# Patient Record
Sex: Male | Born: 1955 | Race: White | Hispanic: No | Marital: Married | Smoking: Current every day smoker
Health system: Southern US, Community
[De-identification: ages and names within clinical notes are randomized; demographics above are authoritative.]

## PROBLEM LIST (undated history)

## (undated) DIAGNOSIS — E785 Hyperlipidemia, unspecified: Secondary | ICD-10-CM

## (undated) DIAGNOSIS — Z9049 Acquired absence of other specified parts of digestive tract: Secondary | ICD-10-CM

## (undated) DIAGNOSIS — N433 Hydrocele, unspecified: Secondary | ICD-10-CM

## (undated) DIAGNOSIS — F32A Depression, unspecified: Secondary | ICD-10-CM

## (undated) DIAGNOSIS — K219 Gastro-esophageal reflux disease without esophagitis: Secondary | ICD-10-CM

## (undated) DIAGNOSIS — J449 Chronic obstructive pulmonary disease, unspecified: Secondary | ICD-10-CM

## (undated) DIAGNOSIS — N529 Male erectile dysfunction, unspecified: Secondary | ICD-10-CM

## (undated) DIAGNOSIS — F411 Generalized anxiety disorder: Secondary | ICD-10-CM

## (undated) DIAGNOSIS — M1990 Unspecified osteoarthritis, unspecified site: Secondary | ICD-10-CM

## (undated) DIAGNOSIS — Z973 Presence of spectacles and contact lenses: Secondary | ICD-10-CM

## (undated) DIAGNOSIS — F329 Major depressive disorder, single episode, unspecified: Secondary | ICD-10-CM

## (undated) HISTORY — DX: Depression, unspecified: F32.A

## (undated) HISTORY — DX: Major depressive disorder, single episode, unspecified: F32.9

## (undated) HISTORY — PX: LAPAROSCOPIC INGUINAL HERNIA REPAIR: SUR788

## (undated) HISTORY — DX: Hyperlipidemia, unspecified: E78.5

---

## 1999-08-19 ENCOUNTER — Emergency Department (HOSPITAL_COMMUNITY): Admission: EM | Admit: 1999-08-19 | Discharge: 1999-08-19 | Payer: Self-pay | Admitting: Emergency Medicine

## 1999-08-24 ENCOUNTER — Ambulatory Visit (HOSPITAL_COMMUNITY): Admission: RE | Admit: 1999-08-24 | Discharge: 1999-08-25 | Payer: Self-pay | Admitting: Ophthalmology

## 1999-08-24 ENCOUNTER — Encounter: Payer: Self-pay | Admitting: Ophthalmology

## 2003-10-12 ENCOUNTER — Emergency Department (HOSPITAL_COMMUNITY): Admission: EM | Admit: 2003-10-12 | Discharge: 2003-10-12 | Payer: Self-pay | Admitting: *Deleted

## 2007-10-05 ENCOUNTER — Emergency Department (HOSPITAL_COMMUNITY): Admission: EM | Admit: 2007-10-05 | Discharge: 2007-10-05 | Payer: Self-pay | Admitting: Emergency Medicine

## 2009-08-23 HISTORY — PX: PARS PLANA VITRECTOMY W/ ENDOPHOTOCOAGULATION: SHX2168

## 2010-04-30 ENCOUNTER — Ambulatory Visit
Admission: RE | Admit: 2010-04-30 | Discharge: 2010-04-30 | Disposition: A | Discharge status: Home / Self Care | Payer: Worker's Compensation | Source: Ambulatory Visit | Attending: Occupational Medicine | Admitting: Occupational Medicine

## 2010-04-30 ENCOUNTER — Other Ambulatory Visit: Payer: Self-pay | Admitting: Occupational Medicine

## 2010-04-30 DIAGNOSIS — IMO0001 Reserved for inherently not codable concepts without codable children: Secondary | ICD-10-CM

## 2010-08-06 NOTE — Op Note (Signed)
[place]. [facility]  Patient:    Jeffery Downs, Jeffery Downs                     MRN: [id number] Proc. Date: 08/24/99 Attending:  Beulah Gandy. Ashley Royalty, M.D.                           Operative Report  DATE OF BIRTH: [birth year]  PREOPERATIVE DIAGNOSIS: 1. Retained lens material in the right eye. 2. Glaucoma, right eye. 3. Iridodialysis, right eye. 4. Previous trauma, right eye.  POSTOPERATIVE DIAGNOSIS:  OPERATION PERFORMED:  Pars plana vitrectomy with retinal photocoagulation in the right eye.  SURGEON:  Beulah Gandy. Ashley Royalty, M.D.  ASSISTANT:  Alma Downs, P.A.  ANESTHESIA:  General.  INDICATIONS FOR PROCEDURE:  Usual prep and drape.  Peritomies at 8, 10 and 2 oclock.  A 4 mm angled infusion port anchored into place at 8 oclock.  The lighted pick and the cutter were placed at 10 and 2 oclock respectively. Contact lens ring was anchored into place at 6 and 12 oclock.  The superior rectus suture was placed.  The vitrectomy was begun in the iris plane where lens material was carefully removed with a vitreous cutter.  There was lens material beneath the iris and this was carefully removed.  The vitrectomy was carried posteriorly where additional lens fragments were encountered.  These were carefully removed with the suction cutter.  The vitrectomy was carried around for 360 degrees down to the vitreous base with a 30 degree prismatic lens.  All vitreous was removed under low suction and rapid cutting.  All remnants were flushed from the anterior chamber angle of the eye as well. These were removed with the vitreous cutter.  Once this was accomplished, the intruments were removed from the eye and 9-0 nylon was used to close the sclerotomy sites.  Indirect ophthalmoscope laser was moved into place and 484 burns were placed around the retinal periphery in two rows.  The power was 400 milliwatts, 1000 microns each and 0.1 second each.  The conjunctiva  was reposited with wetfield cautery.  Polymyxin and gentamicin were irrigated into Ten[place] space.  Atropine solution was applied.  Marcaine was injected around the globe for postoperative pain.  Closing tension was 10 with a Barraquer tonometer.  COMPLICATIONS:  None.  DURATION:  One hour.  The patient was awakened and taken to recovery in satisfactory condition.  DESCRIPTION OF PROCEDURE: DD:  08/24/99 TD:  08/27/99 Job: [id number]

## 2011-08-19 ENCOUNTER — Encounter (INDEPENDENT_AMBULATORY_CARE_PROVIDER_SITE_OTHER): Payer: Self-pay | Admitting: Surgery

## 2011-08-25 ENCOUNTER — Encounter (INDEPENDENT_AMBULATORY_CARE_PROVIDER_SITE_OTHER): Payer: Self-pay | Admitting: Surgery

## 2011-08-25 ENCOUNTER — Ambulatory Visit (INDEPENDENT_AMBULATORY_CARE_PROVIDER_SITE_OTHER): Payer: 59 | Admitting: Surgery

## 2011-08-25 VITALS — BP 121/88 | HR 88 | Temp 98.3°F | Resp 16 | Ht 73.0 in | Wt 182.0 lb

## 2011-08-25 DIAGNOSIS — K4020 Bilateral inguinal hernia, without obstruction or gangrene, not specified as recurrent: Secondary | ICD-10-CM

## 2011-08-25 NOTE — Progress Notes (Signed)
Patient ID: Jeffery Downs, male   DOB: Jul 22, 1955, 56 y.o.   MRN: [id number]  Chief Complaint  Patient presents with  . Pre-op Exam    eval [facility]    HPI Jeffery Downs is a 56 y.o. male.   This is a very pleasant gentleman referred by Dr. Benedetto Goad for a left groin bulge. The patient has noticed it for a year period it always easily reduces. He has also noticed a very small bulge in the right groin. He has almost no discomfort. He has no obstructive symptoms. He is otherwise without complaintsHPI  Past Medical History  Diagnosis Date  . Acute sinusitis, unspecified   . Chronic airway obstruction, not elsewhere classified   . Depression   . Anxiety   . Hyperlipidemia   . Psychosexual dysfunction with inhibited sexual excitement     Past Surgical History  Procedure Date  . Eye surgery     Family History  Problem Relation Age of Onset  . Cancer Mother     breast  . Heart disease Father     Social History History  Substance Use Topics  . Smoking status: Current Everyday Smoker    Types: Cigarettes  . Smokeless tobacco: Not on file  . Alcohol Use: No    No Known Allergies  Current Outpatient Prescriptions  Medication Sig Dispense Refill  . ALPRAZolam (XANAX) 1 MG tablet Take 1 mg by mouth at bedtime as needed.      . citalopram (CELEXA) 20 MG tablet Take 20 mg by mouth daily.        Review of Systems Review of Systems  Constitutional: Negative for fever, chills and unexpected weight change.  HENT: Negative for hearing loss, congestion, sore throat, trouble swallowing and voice change.   Eyes: Negative for visual disturbance.  Respiratory: Negative for cough and wheezing.   Cardiovascular: Negative for chest pain, palpitations and leg swelling.  Gastrointestinal: Negative for nausea, vomiting, abdominal pain, diarrhea, constipation, blood in stool, abdominal distention, anal bleeding and rectal pain.  Genitourinary: Negative for hematuria and difficulty urinating.   Musculoskeletal: Negative for arthralgias.  Skin: Negative for rash and wound.  Neurological: Negative for seizures, syncope, weakness and headaches.  Hematological: Negative for adenopathy. Does not bruise/bleed easily.  Psychiatric/Behavioral: Negative for confusion.    Blood pressure 121/88, pulse 88, temperature 98.3 F (36.8 C), temperature source Temporal, resp. rate 16, height 6\' 1"  (1.854 m), weight 182 lb (82.555 kg), SpO2 98.00%.  Physical Exam Physical Exam  Constitutional: He is oriented to person, place, and time. He appears well-developed and well-nourished. No distress.  HENT:  Head: Normocephalic and atraumatic.  Right Ear: External ear normal.  Left Ear: External ear normal.  Nose: Nose normal.  Mouth/Throat: Oropharynx is clear and moist. No oropharyngeal exudate.  Eyes: Conjunctivae are normal. Pupils are equal, round, and reactive to light. Right eye exhibits no discharge. Left eye exhibits no discharge. No scleral icterus.  Neck: Normal range of motion. Neck supple. No tracheal deviation present. No thyromegaly present.  Cardiovascular: Normal rate, regular rhythm, normal heart sounds and intact distal pulses.   No murmur heard. Pulmonary/Chest: Effort normal and breath sounds normal. No respiratory distress. He has no wheezes. He has no rales.  Abdominal: Soft. Bowel sounds are normal. He exhibits no distension. There is no tenderness. There is no rebound.       He has bilateral easily reducible inguinal hernias. The left is greater than the right  Musculoskeletal: Normal range of  motion. He exhibits no edema and no tenderness.  Lymphadenopathy:    He has no cervical adenopathy.  Neurological: He is alert and oriented to person, place, and time.  Skin: Skin is warm and dry. No rash noted. He is not diaphoretic. No erythema.  Psychiatric: His behavior is normal. Judgment normal.    Data Reviewed   Assessment    Bilateral inguinal hernias    Plan      Repair with mesh was recommended. I discussed with the laparoscopic and open technique. He wishes to proceed with laparoscopic bilateral inguinal hernia repair with mesh. I discussed the risks of surgery which includes but is not limited to bleeding, infection, nerve entrapment, chronic pain, recurrence, voiding issues, postop recovery, et Karie Soda. He understands and wishes to proceed. Likely that of success is good       Jeffery Downs A 08/25/2011, 2:34 PM

## 2011-09-12 DIAGNOSIS — K4020 Bilateral inguinal hernia, without obstruction or gangrene, not specified as recurrent: Secondary | ICD-10-CM

## 2011-10-03 ENCOUNTER — Other Ambulatory Visit: Payer: Self-pay | Admitting: *Deleted

## 2011-10-04 ENCOUNTER — Encounter (INDEPENDENT_AMBULATORY_CARE_PROVIDER_SITE_OTHER): Payer: Self-pay

## 2011-10-11 ENCOUNTER — Encounter (INDEPENDENT_AMBULATORY_CARE_PROVIDER_SITE_OTHER): Payer: 59 | Admitting: Surgery

## 2011-10-19 ENCOUNTER — Ambulatory Visit (INDEPENDENT_AMBULATORY_CARE_PROVIDER_SITE_OTHER): Payer: 59 | Admitting: Surgery

## 2011-10-19 ENCOUNTER — Encounter (INDEPENDENT_AMBULATORY_CARE_PROVIDER_SITE_OTHER): Payer: Self-pay | Admitting: Surgery

## 2011-10-19 VITALS — BP 115/66 | HR 82 | Temp 98.6°F | Resp 14 | Ht 73.0 in | Wt 181.0 lb

## 2011-10-19 DIAGNOSIS — Z09 Encounter for follow-up examination after completed treatment for conditions other than malignant neoplasm: Secondary | ICD-10-CM

## 2011-10-19 NOTE — Progress Notes (Signed)
Subjective:     Patient ID: Jeffery Downs, male   DOB: April 05, 1955, 56 y.o.   MRN: [id number]  HPI  He is here for his first postop visit status post bilateral lap inguinal hernia repair with mesh. He is doing well has no complaints. He is 4 weeks out from surgery Review of Systems     Objective:   Physical Exam On exam, his incisions are well healed. There is no evidence of recurrent hernia    Assessment:     Patient doing well postop    Plan:     He may return to normal activity. I will see him back as needed

## 2014-09-11 ENCOUNTER — Encounter (HOSPITAL_BASED_OUTPATIENT_CLINIC_OR_DEPARTMENT_OTHER): Admission: RE | Payer: Self-pay | Source: Ambulatory Visit

## 2014-09-11 ENCOUNTER — Ambulatory Visit (HOSPITAL_BASED_OUTPATIENT_CLINIC_OR_DEPARTMENT_OTHER): Admission: RE | Admit: 2014-09-11 | Payer: 59 | Source: Ambulatory Visit | Admitting: Otolaryngology

## 2014-09-11 SURGERY — EXCISION, PAROTID GLAND
Anesthesia: General | Laterality: Left

## 2014-09-23 ENCOUNTER — Ambulatory Visit: Payer: Self-pay | Admitting: Otolaryngology

## 2014-09-23 ENCOUNTER — Encounter (HOSPITAL_BASED_OUTPATIENT_CLINIC_OR_DEPARTMENT_OTHER): Payer: Self-pay | Admitting: *Deleted

## 2014-09-23 NOTE — H&P (Signed)
Assessment  Chronic obstructive pulmonary disease (496) (J44.9). 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 We discussed the nature of this problem and recommend surgical excision of the gland. Risks and benefits of surgery discussed, specifically the risk of injury to the facial nerve. All questions and concerns addressed. Consent obtained. Patient will be contacted for scheduling at the outpatient surgical center. With his line of work, he will need to plan to be out of work about two weeks and no heavy lifting for three weeks. Advised he quit smoking. Plan  I have interviewed and examined the patient and developed the proposed treatment plan.  Sherlock Nancarrow H. Constance Holster, M.D. Reason For Visit  Jeffery Downs is here today at the kind request of Jeffery Downs for consultation and opinion. Left sided neck mass. HPI  Patient is a 59 year old male who presents for left neck mass. It has been present about 1.5 years. He noticed it while shaving. Over the past several months it seems to be more noticeable and he believes it has increased in size. It generally does not bother him and has never been infected. He did complete a course of antibiotics for this about a year ago without change. No imaging or biopsy. Denies fever, dysphagia, odynophagia, hoarseness, cough.   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 Last week was started on ProAir for mild emphysema. No diabetes, bleeding disorder or history of adverse reaction to anesthesia. Current smoker, smokes 1ppd. He has tried Chantix but he could not tolerate the side effects. Allergies  Chantix TABS; Adverse Reaction; Depression,Nightmares. Current Meds  ALPRAZolam 1 MG Oral Tablet;TAKE 1/2-1 TABLET 3 TIMES A DAY AS NEEDED FOR ANXIETY.; Rx * [facility] of [facility]  [facility]13.  This has been called into them at [phone], 04 Sep 2013 Naproxen Sodium 550 MG Oral Tablet;TAKE 1 TABLET EVERY 12 HOURS AS NEEDED.; Rx Levitra 20 MG Oral Tablet;TAKE 1/2  TO 1 TABLET 1 HOUR BEFORE SEX, FOR E.D.; Rx Citalopram Hydrobromide 40 MG Oral Tablet;Take one tablet daily, routinely, to control anxiety.; Rx ProAir HFA 108 (90 Base) MCG/ACT Inhalation Aerosol Solution;INHALE 1 TO 2 PUFFS EVERY 4 TO 6 HOURS AS NEEDED.; Rx Pantoprazole Sodium 40 MG Oral Tablet Delayed Release;TAKE 1 TABLET BY MOUTH EVERY DAY TO CONTROL ACID REFLUX SYMPTOMS.; Rx. Active Problems  Chronic obstructive pulmonary disease   (496) (J44.9) Depression   (311) (F32.9) Generalized anxiety disorder   (300.02) (F41.1) History of bilateral inguinal hernias   (V45.89) (Z98.89) Hyperlipidemia   (272.4) (E78.5) Male erectile disorder   (607.84) (N52.9) Mass of left side of neck   (784.2) (R22.1) Right shoulder pain   (719.41) (M25.511) Tobacco use disorder   (305.1) (Z72.0). [place]  [facility]. Family Hx  Cancer: Mother; breast still living. Family history of dementia: Father (V17.2) (Z81.8) Family history of myocardial infarction: Father (V17.3) (Z82.49) Stroke Syndrome: Father (V17.1); 6 bypasses 3 years ago. Still living. Personal Hx  Current every day smoker (305.1) (F17.200) Current smoker (305.1) (Z72.0). ROS  Systemic: Not feeling tired (fatigue).  No fever, no night sweats, and no recent weight loss. Head: No headache. Eyes: No eye symptoms. Otolaryngeal: No hearing loss, no earache, no tinnitus, and no purulent nasal discharge.  No nasal passage blockage (stuffiness), no snoring, no sneezing,  no hoarseness, and no sore throat. Cardiovascular: No chest pain or discomfort  and no palpitations. Pulmonary: No dyspnea, no cough, and no wheezing. Gastrointestinal: No dysphagia.  Heartburn.  No nausea, no abdominal pain, and no melena.  No diarrhea. Genitourinary: No dysuria. Endocrine: No muscle  weakness. Musculoskeletal: No calf muscle cramps, no arthralgias, and no soft tissue swelling. Neurological: No dizziness, no fainting, no tingling, and no numbness. Psychological: No anxiety  and no depression. Skin: No rash. 12 system ROS was obtained and reviewed on the Health Maintenance form dated today.  Positive responses are shown above.  If the symptom is not checked, the patient has denied it. Vital Signs   Recorded by Dan Maker on 28 Aug 2014 01:09 PM BP:128/87,  BSA Calculated: 2.09 ,  BMI Calculated: 24.54 ,  Weight: 186 lb , BMI: 24.5 kg/m2,  Height: 6 ft 1 in. Physical Exam  APPEARANCE: Well developed, well nourished, in no acute distress.  Normal affect, in a pleasant mood.  Oriented to time, place and person. COMMUNICATION: Normal voice   HEAD & FACE:  No scars, lesions or masses of head and face.  Sinuses nontender to palpation.  Left parotid tail with 1.5-2cm mass, nontender.  Facial strength symmetric.   EYES: EOMI with normal primary gaze alignment. Visual acuity grossly intact.  PERRLA EXTERNAL EAR & NOSE: No scars, lesions or masses  EAC & TYMPANIC MEMBRANE:  EAC shows no obstructing lesions or debris and tympanic membranes are normal bilaterally. GROSS HEARING: Normal   TMJ:  Nontender  INTRANASAL EXAM: No polyps or purulence.  NASOPHARYNX: Normal, without lesions. LIPS, TEETH & GUMS: No lip lesions, normal dentition and normal gums. ORAL CAVITY/OROPHARYNX:  Oral mucosa moist without lesion or asymmetry of the palate, tongue, tonsil or posterior pharynx. NECK:  Supple without adenopathy. Left parotid tail mass 1.5-2cm. THYROID:  Normal with no masses palpable.  NEUROLOGIC:  No gross CN deficits. No nystagmus noted.   LYMPHATIC:  No enlarged nodes palpable. Signature  Electronically signed by : Jolene Provost  PA-C; 08/28/2014 1:45 PM EST. Electronically signed by : Izora Gala  M.D.; 08/28/2014 2:05 PM EST. Reviewed by : Christain Sacramento  M.D.; 08/30/2014  2:25 PM EST.

## 2014-09-26 ENCOUNTER — Ambulatory Visit (HOSPITAL_BASED_OUTPATIENT_CLINIC_OR_DEPARTMENT_OTHER): Payer: 59 | Admitting: Anesthesiology

## 2014-09-26 ENCOUNTER — Ambulatory Visit (HOSPITAL_BASED_OUTPATIENT_CLINIC_OR_DEPARTMENT_OTHER)
Admission: RE | Admit: 2014-09-26 | Discharge: 2014-09-27 | Disposition: A | Discharge status: Home / Self Care | Payer: 59 | Source: Ambulatory Visit | Attending: Otolaryngology | Admitting: Otolaryngology

## 2014-09-26 ENCOUNTER — Encounter (HOSPITAL_BASED_OUTPATIENT_CLINIC_OR_DEPARTMENT_OTHER)
Admission: RE | Disposition: A | Discharge status: Home / Self Care | Payer: Self-pay | Source: Ambulatory Visit | Attending: Otolaryngology

## 2014-09-26 ENCOUNTER — Encounter (HOSPITAL_BASED_OUTPATIENT_CLINIC_OR_DEPARTMENT_OTHER): Payer: Self-pay

## 2014-09-26 DIAGNOSIS — E785 Hyperlipidemia, unspecified: Secondary | ICD-10-CM | POA: Diagnosis not present

## 2014-09-26 DIAGNOSIS — Z79899 Other long term (current) drug therapy: Secondary | ICD-10-CM | POA: Insufficient documentation

## 2014-09-26 DIAGNOSIS — F17210 Nicotine dependence, cigarettes, uncomplicated: Secondary | ICD-10-CM | POA: Diagnosis not present

## 2014-09-26 DIAGNOSIS — K118 Other diseases of salivary glands: Secondary | ICD-10-CM | POA: Diagnosis present

## 2014-09-26 DIAGNOSIS — D110 Benign neoplasm of parotid gland: Secondary | ICD-10-CM | POA: Diagnosis not present

## 2014-09-26 DIAGNOSIS — Z8249 Family history of ischemic heart disease and other diseases of the circulatory system: Secondary | ICD-10-CM | POA: Diagnosis not present

## 2014-09-26 DIAGNOSIS — J449 Chronic obstructive pulmonary disease, unspecified: Secondary | ICD-10-CM | POA: Insufficient documentation

## 2014-09-26 DIAGNOSIS — R221 Localized swelling, mass and lump, neck: Secondary | ICD-10-CM | POA: Diagnosis present

## 2014-09-26 HISTORY — PX: PAROTIDECTOMY: SHX2163

## 2014-09-26 HISTORY — DX: Unspecified osteoarthritis, unspecified site: M19.90

## 2014-09-26 HISTORY — DX: Gastro-esophageal reflux disease without esophagitis: K21.9

## 2014-09-26 LAB — POCT HEMOGLOBIN-HEMACUE: HEMOGLOBIN: 16.5 g/dL (ref 13.0–17.0)

## 2014-09-26 SURGERY — EXCISION, PAROTID GLAND
Anesthesia: General | Laterality: Left

## 2014-09-26 MED ORDER — FENTANYL CITRATE (PF) 100 MCG/2ML IJ SOLN
50.0000 ug | INTRAMUSCULAR | Status: AC | PRN
  Administered 2014-09-26: 100 ug via INTRAVENOUS
  Administered 2014-09-26 (×2): 25 ug via INTRAVENOUS

## 2014-09-26 MED ORDER — SUCCINYLCHOLINE CHLORIDE 20 MG/ML IJ SOLN
INTRAMUSCULAR | Status: DC | PRN
  Administered 2014-09-26: 80 mg via INTRAVENOUS

## 2014-09-26 MED ORDER — OXYCODONE HCL 5 MG PO TABS: 5.0000 mg | ORAL_TABLET | Freq: Once | ORAL | Status: AC | PRN

## 2014-09-26 MED ORDER — MIDAZOLAM HCL 2 MG/2ML IJ SOLN
1.0000 mg | INTRAMUSCULAR | Status: DC | PRN
  Administered 2014-09-26: 2 mg via INTRAVENOUS

## 2014-09-26 MED ORDER — BUPIVACAINE HCL (PF) 0.25 % IJ SOLN
INTRAMUSCULAR | Status: AC
  Filled 2014-09-26: qty 30

## 2014-09-26 MED ORDER — LACTATED RINGERS IV SOLN
INTRAVENOUS | Status: DC
  Administered 2014-09-26: 08:00:00 via INTRAVENOUS

## 2014-09-26 MED ORDER — CITALOPRAM HYDROBROMIDE 40 MG PO TABS
40.0000 mg | ORAL_TABLET | Freq: Every day | ORAL | Status: DC
  Administered 2014-09-26: 20 mg via ORAL

## 2014-09-26 MED ORDER — 0.9 % SODIUM CHLORIDE (POUR BTL) OPTIME
TOPICAL | Status: DC | PRN
  Administered 2014-09-26: 100 mL

## 2014-09-26 MED ORDER — BACITRACIN ZINC 500 UNIT/GM EX OINT
TOPICAL_OINTMENT | CUTANEOUS | Status: AC
  Filled 2014-09-26: qty 0.9

## 2014-09-26 MED ORDER — ALPRAZOLAM 1 MG PO TABS
1.0000 mg | ORAL_TABLET | Freq: Every evening | ORAL | Status: DC | PRN
Start: 2014-09-26 — End: 2014-09-27

## 2014-09-26 MED ORDER — SCOPOLAMINE 1 MG/3DAYS TD PT72
1.0000 | MEDICATED_PATCH | Freq: Once | TRANSDERMAL | Status: DC | PRN
Start: 2014-09-26 — End: 2014-09-26

## 2014-09-26 MED ORDER — HYDROMORPHONE HCL 1 MG/ML IJ SOLN
0.2500 mg | INTRAMUSCULAR | Status: DC | PRN
  Administered 2014-09-26 (×2): 0.5 mg via INTRAVENOUS

## 2014-09-26 MED ORDER — DEXTROSE-NACL 5-0.9 % IV SOLN
INTRAVENOUS | Status: DC
Start: 2014-09-26 — End: 2014-09-27
  Administered 2014-09-26 (×2): via INTRAVENOUS

## 2014-09-26 MED ORDER — PROMETHAZINE HCL 25 MG RE SUPP: 25.0000 mg | Freq: Four times a day (QID) | RECTAL | Status: DC | PRN

## 2014-09-26 MED ORDER — FENTANYL CITRATE (PF) 100 MCG/2ML IJ SOLN
INTRAMUSCULAR | Status: AC
  Filled 2014-09-26: qty 8

## 2014-09-26 MED ORDER — MIDAZOLAM HCL 2 MG/2ML IJ SOLN
INTRAMUSCULAR | Status: AC
  Filled 2014-09-26: qty 2

## 2014-09-26 MED ORDER — HYDROCODONE-ACETAMINOPHEN 7.5-325 MG PO TABS: 1.0000 | ORAL_TABLET | Freq: Four times a day (QID) | ORAL | Status: DC | PRN

## 2014-09-26 MED ORDER — LIDOCAINE-EPINEPHRINE 1 %-1:100000 IJ SOLN
INTRAMUSCULAR | Status: AC
  Filled 2014-09-26: qty 1

## 2014-09-26 MED ORDER — PANTOPRAZOLE SODIUM 40 MG PO TBEC: 40.0000 mg | DELAYED_RELEASE_TABLET | Freq: Every day | ORAL | Status: DC

## 2014-09-26 MED ORDER — IBUPROFEN 100 MG/5ML PO SUSP
400.0000 mg | Freq: Four times a day (QID) | ORAL | Status: DC | PRN
  Administered 2014-09-26: 400 mg via ORAL

## 2014-09-26 MED ORDER — ONDANSETRON HCL 4 MG/2ML IJ SOLN
INTRAMUSCULAR | Status: DC | PRN
  Administered 2014-09-26: 4 mg via INTRAVENOUS

## 2014-09-26 MED ORDER — PROMETHAZINE HCL 25 MG/ML IJ SOLN
INTRAMUSCULAR | Status: AC
  Filled 2014-09-26: qty 1

## 2014-09-26 MED ORDER — HYDROMORPHONE HCL 1 MG/ML IJ SOLN
INTRAMUSCULAR | Status: AC
  Filled 2014-09-26: qty 1

## 2014-09-26 MED ORDER — DEXAMETHASONE SODIUM PHOSPHATE 4 MG/ML IJ SOLN
INTRAMUSCULAR | Status: DC | PRN
  Administered 2014-09-26: 10 mg via INTRAVENOUS

## 2014-09-26 MED ORDER — OXYCODONE HCL 5 MG/5ML PO SOLN
5.0000 mg | Freq: Once | ORAL | Status: AC | PRN
Start: 2014-09-26 — End: 2014-09-26

## 2014-09-26 MED ORDER — PROMETHAZINE HCL 25 MG PO TABS: 25.0000 mg | ORAL_TABLET | Freq: Four times a day (QID) | ORAL | Status: DC | PRN

## 2014-09-26 MED ORDER — CEFAZOLIN SODIUM-DEXTROSE 2-3 GM-% IV SOLR
2.0000 g | INTRAVENOUS | Status: AC
  Administered 2014-09-26: 2 g via INTRAVENOUS

## 2014-09-26 MED ORDER — IBUPROFEN 100 MG/5ML PO SUSP
ORAL | Status: AC
  Filled 2014-09-26: qty 20

## 2014-09-26 MED ORDER — PROPOFOL 500 MG/50ML IV EMUL
INTRAVENOUS | Status: AC
  Filled 2014-09-26: qty 50

## 2014-09-26 MED ORDER — LIDOCAINE HCL (CARDIAC) 10 MG/ML IV SOLN
INTRAVENOUS | Status: DC | PRN
  Administered 2014-09-26: 80 mg via INTRAVENOUS

## 2014-09-26 MED ORDER — PROMETHAZINE HCL 25 MG/ML IJ SOLN
6.2500 mg | INTRAMUSCULAR | Status: DC | PRN
  Administered 2014-09-26: 6.25 mg via INTRAVENOUS

## 2014-09-26 MED ORDER — GLYCOPYRROLATE 0.2 MG/ML IJ SOLN: 0.2000 mg | Freq: Once | INTRAMUSCULAR | Status: DC | PRN

## 2014-09-26 MED ORDER — CEFAZOLIN SODIUM-DEXTROSE 2-3 GM-% IV SOLR
INTRAVENOUS | Status: AC
  Filled 2014-09-26: qty 50

## 2014-09-26 MED ORDER — SODIUM CHLORIDE 0.9 % IJ SOLN
INTRAMUSCULAR | Status: AC
  Filled 2014-09-26: qty 10

## 2014-09-26 MED ORDER — HYDROCODONE-ACETAMINOPHEN 5-325 MG PO TABS
1.0000 | ORAL_TABLET | Freq: Four times a day (QID) | ORAL | Status: DC | PRN
  Administered 2014-09-26 – 2014-09-27 (×3): 1 via ORAL
  Filled 2014-09-26 (×3): qty 1

## 2014-09-26 MED ORDER — PROPOFOL 10 MG/ML IV BOLUS
INTRAVENOUS | Status: DC | PRN
  Administered 2014-09-26: 200 mg via INTRAVENOUS

## 2014-09-26 MED ORDER — ALBUTEROL SULFATE HFA 108 (90 BASE) MCG/ACT IN AERS: 1.0000 | INHALATION_SPRAY | Freq: Four times a day (QID) | RESPIRATORY_TRACT | Status: DC | PRN

## 2014-09-26 MED ORDER — ACETAMINOPHEN 10 MG/ML IV SOLN
INTRAVENOUS | Status: AC
  Filled 2014-09-26: qty 100

## 2014-09-26 SURGICAL SUPPLY — 65 items
ATTRACTOMAT 16X20 MAGNETIC DRP (DRAPES) IMPLANT
BENZOIN TINCTURE PRP APPL 2/3 (GAUZE/BANDAGES/DRESSINGS) IMPLANT
BLADE SURG 15 STRL LF DISP TIS (BLADE) ×1 IMPLANT
BLADE SURG 15 STRL SS (BLADE) ×2
CANISTER SUCT 1200ML W/VALVE (MISCELLANEOUS) ×3 IMPLANT
CLEANER CAUTERY TIP 5X5 PAD (MISCELLANEOUS) ×1 IMPLANT
CLOSURE WOUND 1/2 X4 (GAUZE/BANDAGES/DRESSINGS)
CORDS BIPOLAR (ELECTRODE) ×3 IMPLANT
COVER BACK TABLE 60X90IN (DRAPES) ×3 IMPLANT
COVER MAYO STAND STRL (DRAPES) ×3 IMPLANT
DERMABOND ADVANCED (GAUZE/BANDAGES/DRESSINGS) ×2
DERMABOND ADVANCED .7 DNX12 (GAUZE/BANDAGES/DRESSINGS) ×1 IMPLANT
DRAIN JACKSON RD 7FR 3/32 (WOUND CARE) IMPLANT
DRAIN TLS ROUND 10FR (DRAIN) IMPLANT
DRAPE INCISE 23X17 IOBAN STRL (DRAPES) ×2
DRAPE INCISE IOBAN 23X17 STRL (DRAPES) ×1 IMPLANT
DRAPE U-SHAPE 76X120 STRL (DRAPES) ×3 IMPLANT
ELECT COATED BLADE 2.86 ST (ELECTRODE) ×3 IMPLANT
ELECT REM PT RETURN 9FT ADLT (ELECTROSURGICAL) ×3
ELECTRODE REM PT RTRN 9FT ADLT (ELECTROSURGICAL) ×1 IMPLANT
EVACUATOR SILICONE 100CC (DRAIN) ×3 IMPLANT
GAUZE SPONGE 4X4 16PLY XRAY LF (GAUZE/BANDAGES/DRESSINGS) IMPLANT
GLOVE BIO SURGEON STRL SZ 6.5 (GLOVE) ×2 IMPLANT
GLOVE BIO SURGEON STRL SZ7 (GLOVE) IMPLANT
GLOVE BIO SURGEON STRL SZ7.5 (GLOVE) IMPLANT
GLOVE BIO SURGEONS STRL SZ 6.5 (GLOVE) ×1
GLOVE BIOGEL PI IND STRL 8 (GLOVE) IMPLANT
GLOVE BIOGEL PI INDICATOR 8 (GLOVE)
GLOVE ECLIPSE 7.5 STRL STRAW (GLOVE) ×3 IMPLANT
GLOVE ECLIPSE 8.0 STRL XLNG CF (GLOVE) IMPLANT
GLOVE SS BIOGEL STRL SZ 7.5 (GLOVE) IMPLANT
GLOVE SUPERSENSE BIOGEL SZ 7.5 (GLOVE)
GLOVE SURG SS PI 7.0 STRL IVOR (GLOVE) ×6 IMPLANT
GOWN STRL REUS W/ TWL LRG LVL3 (GOWN DISPOSABLE) ×2 IMPLANT
GOWN STRL REUS W/ TWL XL LVL3 (GOWN DISPOSABLE) ×1 IMPLANT
GOWN STRL REUS W/TWL LRG LVL3 (GOWN DISPOSABLE) ×4
GOWN STRL REUS W/TWL XL LVL3 (GOWN DISPOSABLE) ×2
LOCATOR NERVE 3 VOLT (DISPOSABLE) IMPLANT
NEEDLE HYPO 25X1 1.5 SAFETY (NEEDLE) IMPLANT
NEEDLE PRECISIONGLIDE 27X1.5 (NEEDLE) IMPLANT
NS IRRIG 1000ML POUR BTL (IV SOLUTION) ×3 IMPLANT
PACK BASIN DAY SURGERY FS (CUSTOM PROCEDURE TRAY) ×3 IMPLANT
PAD CLEANER CAUTERY TIP 5X5 (MISCELLANEOUS) ×2
PENCIL FOOT CONTROL (ELECTRODE) ×3 IMPLANT
PIN SAFETY STERILE (MISCELLANEOUS) IMPLANT
SHEARS HARMONIC 9CM CVD (BLADE) ×3 IMPLANT
SHEET MEDIUM DRAPE 40X70 STRL (DRAPES) IMPLANT
SLEEVE SCD COMPRESS KNEE MED (MISCELLANEOUS) ×3 IMPLANT
SPONGE GAUZE 4X4 12PLY STER LF (GAUZE/BANDAGES/DRESSINGS) ×3 IMPLANT
STAPLER VISISTAT 35W (STAPLE) ×3 IMPLANT
STRIP CLOSURE SKIN 1/2X4 (GAUZE/BANDAGES/DRESSINGS) IMPLANT
SUCTION FRAZIER TIP 10 FR DISP (SUCTIONS) IMPLANT
SUT CHROMIC 3 0 PS 2 (SUTURE) ×3 IMPLANT
SUT ETHILON 3 0 PS 1 (SUTURE) ×3 IMPLANT
SUT ETHILON 5 0 P 3 18 (SUTURE)
SUT NYLON ETHILON 5-0 P-3 1X18 (SUTURE) IMPLANT
SUT PLAIN 5 0 P 3 18 (SUTURE) IMPLANT
SUT SILK 3 0 PS 1 (SUTURE) ×3 IMPLANT
SUT SILK 3 0 SH CR/8 (SUTURE) IMPLANT
SUT SILK 4 0 TIES 17X18 (SUTURE) ×3 IMPLANT
SYR BULB 3OZ (MISCELLANEOUS) ×3 IMPLANT
SYR CONTROL 10ML LL (SYRINGE) IMPLANT
TOWEL OR 17X24 6PK STRL BLUE (TOWEL DISPOSABLE) ×6 IMPLANT
TUBE CONNECTING 20'X1/4 (TUBING) ×1
TUBE CONNECTING 20X1/4 (TUBING) ×2 IMPLANT

## 2014-09-26 NOTE — Anesthesia Preprocedure Evaluation (Addendum)
Anesthesia Evaluation  Patient identified by MRN, date of birth, ID band Patient awake    Reviewed: Allergy & Precautions, NPO status , Patient's Chart, lab work & pertinent test results  Airway Mallampati: II  TM Distance: >3 FB Neck ROM: Full    Dental   Pulmonary Current Smoker,  breath sounds clear to auscultation        Cardiovascular negative cardio ROS  Rhythm:Regular Rate:Normal     Neuro/Psych Anxiety Depression negative neurological ROS     GI/Hepatic Neg liver ROS, GERD-  ,  Endo/Other  negative endocrine ROS  Renal/GU negative Renal ROS     Musculoskeletal   Abdominal   Peds  Hematology negative hematology ROS (+)   Anesthesia Other Findings   Reproductive/Obstetrics                            Anesthesia Physical Anesthesia Plan  ASA: II  Anesthesia Plan: General   Post-op Pain Management:    Induction: Intravenous  Airway Management Planned: Oral ETT  Additional Equipment:   Intra-op Plan:   Post-operative Plan: Extubation in OR  Informed Consent: I have reviewed the patients History and Physical, chart, labs and discussed the procedure including the risks, benefits and alternatives for the proposed anesthesia with the patient or authorized representative who has indicated his/her understanding and acceptance.   Dental advisory given  Plan Discussed with: CRNA  Anesthesia Plan Comments:         Anesthesia Quick Evaluation

## 2014-09-26 NOTE — Transfer of Care (Signed)
Immediate Anesthesia Transfer of Care Note  Patient: Jeffery Downs  Procedure(s) Performed: Procedure(s): LEFT PAROTIDECTOMY (Left)  Patient Location: PACU  Anesthesia Type:General  Level of Consciousness: awake, sedated and patient cooperative  Airway & Oxygen Therapy: Patient Spontanous Breathing and Patient connected to face mask oxygen  Post-op Assessment: Report given to RN and Post -op Vital signs reviewed and stable  Post vital signs: Reviewed and stable  Last Vitals:  Filed Vitals:   09/26/14 0701  BP: 125/84  Pulse: 65  Temp: 36.7 C  Resp: 20    Complications: No apparent anesthesia complications

## 2014-09-26 NOTE — Interval H&P Note (Signed)
History and Physical Interval Note:  09/26/2014 8:05 AM  Jeffery Downs  has presented today for surgery, with the diagnosis of LEFT PAROTID MASS  The various methods of treatment have been discussed with the patient and family. After consideration of risks, benefits and other options for treatment, the patient has consented to  Procedure(s): LEFT PAROTIDECTOMY (Left) as a surgical intervention .  The patient's history has been reviewed, patient examined, no change in status, stable for surgery.  I have reviewed the patient's chart and labs.  Questions were answered to the patient's satisfaction.     Jeffery Downs

## 2014-09-26 NOTE — Progress Notes (Signed)
09/26/2014 5:52 PM  Jeffery Downs [id number]  Post-Op Check   Temp:  [98 F (36.7 C)-98.2 F (36.8 C)] 98.1 F (36.7 C) (07/08 1530) Pulse Rate:  [65-77] 77 (07/08 1130) Resp:  [10-21] 20 (07/08 1530) BP: (113-138)/(63-85) 113/63 mmHg (07/08 1530) SpO2:  [91 %-100 %] 95 % (07/08 1530) Weight:  [81.92 kg (180 lb 9.6 oz)] 81.92 kg (180 lb 9.6 oz) (07/08 0701),     Intake/Output Summary (Last 24 hours) at 09/26/14 1752 Last data filed at 09/26/14 1530  Gross per 24 hour  Intake   1805 ml  Output   1085 ml  Net    720 ml   JP 10 ml  Results for orders placed or performed during the hospital encounter of 09/26/14 (from the past 24 hour(s))  Hemoglobin-hemacue, POC     Status: None   Collection Time: 09/26/14  7:19 AM  Result Value Ref Range   Hemoglobin 16.5 13.0 - 17.0 g/dL    SUBJECTIVE:  Min pain.  Breathing well.  Spont void.    OBJECTIVE:  Facial n intact.  Wound flat.  Drain functional.  IMPRESSION:  Satisfactory  check  PLAN:  Drain out and discharge home in AM.    [place], [place]

## 2014-09-26 NOTE — H&P (View-Only) (Signed)
Assessment  Chronic obstructive pulmonary disease (496) (J44.9). 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 We discussed the nature of this problem and recommend surgical excision of the gland. Risks and benefits of surgery discussed, specifically the risk of injury to the facial nerve. All questions and concerns addressed. Consent obtained. Patient will be contacted for scheduling at the outpatient surgical center. With his line of work, he will need to plan to be out of work about two weeks and no heavy lifting for three weeks. Advised he quit smoking. Plan  I have interviewed and examined the patient and developed the proposed treatment plan.  Orrin Yurkovich H. Constance Holster, M.D. Reason For Visit  Jeffery Downs is here today at the kind request of Kathryne Eriksson for consultation and opinion. Left sided neck mass. HPI  Patient is a 59 year old male who presents for left neck mass. It has been present about 1.5 years. He noticed it while shaving. Over the past several months it seems to be more noticeable and he believes it has increased in size. It generally does not bother him and has never been infected. He did complete a course of antibiotics for this about a year ago without change. No imaging or biopsy. Denies fever, dysphagia, odynophagia, hoarseness, cough.   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 Last week was started on ProAir for mild emphysema. No diabetes, bleeding disorder or history of adverse reaction to anesthesia. Current smoker, smokes 1ppd. He has tried Chantix but he could not tolerate the side effects. Allergies  Chantix TABS; Adverse Reaction; Depression,Nightmares. Current Meds  ALPRAZolam 1 MG Oral Tablet;TAKE 1/2-1 TABLET 3 TIMES A DAY AS NEEDED FOR ANXIETY.; Rx * [facility].  This has been called into them at [phone], 04 Sep 2013 Naproxen Sodium 550 MG Oral Tablet;TAKE 1 TABLET EVERY 12 HOURS AS NEEDED.; Rx Levitra 20 MG Oral Tablet;TAKE 1/2  TO 1 TABLET 1 HOUR BEFORE SEX, FOR E.D.; Rx Citalopram Hydrobromide 40 MG Oral Tablet;Take one tablet daily, routinely, to control anxiety.; Rx ProAir HFA 108 (90 Base) MCG/ACT Inhalation Aerosol Solution;INHALE 1 TO 2 PUFFS EVERY 4 TO 6 HOURS AS NEEDED.; Rx Pantoprazole Sodium 40 MG Oral Tablet Delayed Release;TAKE 1 TABLET BY MOUTH EVERY DAY TO CONTROL ACID REFLUX SYMPTOMS.; Rx. Active Problems  Chronic obstructive pulmonary disease   (496) (J44.9) Depression   (311) (F32.9) Generalized anxiety disorder   (300.02) (F41.1) History of bilateral inguinal hernias   (V45.89) (Z98.89) Hyperlipidemia   (272.4) (E78.5) Male erectile disorder   (607.84) (N52.9) Mass of left side of neck   (784.2) (R22.1) Right shoulder pain   (719.41) (M25.511) Tobacco use disorder   (305.1) (Z72.0). [place]  [facility]. Family Hx  Cancer: Mother; breast still living. Family history of dementia: Father (V17.2) (Z81.8) Family history of myocardial infarction: Father (V17.3) (Z82.49) Stroke Syndrome: Father (V17.1); 6 bypasses 3 years ago. Still living. Personal Hx  Current every day smoker (305.1) (F17.200) Current smoker (305.1) (Z72.0). ROS  Systemic: Not feeling tired (fatigue).  No fever, no night sweats, and no recent weight loss. Head: No headache. Eyes: No eye symptoms. Otolaryngeal: No hearing loss, no earache, no tinnitus, and no purulent nasal discharge.  No nasal passage blockage (stuffiness), no snoring, no sneezing,  no hoarseness, and no sore throat. Cardiovascular: No chest pain or discomfort  and no palpitations. Pulmonary: No dyspnea, no cough, and no wheezing. Gastrointestinal: No dysphagia.  Heartburn.  No nausea, no abdominal pain, and no melena.  No diarrhea. Genitourinary: No dysuria. Endocrine: No muscle  weakness. Musculoskeletal: No calf muscle cramps, no arthralgias, and no soft tissue swelling. Neurological: No dizziness, no fainting, no tingling, and no numbness. Psychological: No anxiety  and no depression. Skin: No rash. 12 system ROS was obtained and reviewed on the Health Maintenance form dated today.  Positive responses are shown above.  If the symptom is not checked, the patient has denied it. Vital Signs   Recorded by Dan Maker on 28 Aug 2014 01:09 PM BP:128/87,  BSA Calculated: 2.09 ,  BMI Calculated: 24.54 ,  Weight: 186 lb , BMI: 24.5 kg/m2,  Height: 6 ft 1 in. Physical Exam  APPEARANCE: Well developed, well nourished, in no acute distress.  Normal affect, in a pleasant mood.  Oriented to time, place and person. COMMUNICATION: Normal voice   HEAD & FACE:  No scars, lesions or masses of head and face.  Sinuses nontender to palpation.  Left parotid tail with 1.5-2cm mass, nontender.  Facial strength symmetric.   EYES: EOMI with normal primary gaze alignment. Visual acuity grossly intact.  PERRLA EXTERNAL EAR & NOSE: No scars, lesions or masses  EAC & TYMPANIC MEMBRANE:  EAC shows no obstructing lesions or debris and tympanic membranes are normal bilaterally. GROSS HEARING: Normal   TMJ:  Nontender  INTRANASAL EXAM: No polyps or purulence.  NASOPHARYNX: Normal, without lesions. LIPS, TEETH & GUMS: No lip lesions, normal dentition and normal gums. ORAL CAVITY/OROPHARYNX:  Oral mucosa moist without lesion or asymmetry of the palate, tongue, tonsil or posterior pharynx. NECK:  Supple without adenopathy. Left parotid tail mass 1.5-2cm. THYROID:  Normal with no masses palpable.  NEUROLOGIC:  No gross CN deficits. No nystagmus noted.   LYMPHATIC:  No enlarged nodes palpable. Signature  Electronically signed by : Jolene Provost  PA-C; 08/28/2014 1:45 PM EST. Electronically signed by : Izora Gala  M.D.; 08/28/2014 2:05 PM EST. Reviewed by : Christain Sacramento  M.D.; 08/30/2014  2:25 PM EST.

## 2014-09-26 NOTE — Op Note (Signed)
OPERATIVE REPORT  DATE OF SURGERY: 09/26/2014  PATIENT:  Jeffery Downs,  59 y.o. male  PRE-OPERATIVE DIAGNOSIS: LEFT PAROTID MASS  POST-OPERATIVE DIAGNOSIS: LEFT PAROTID MASS  PROCEDURE: Procedure(s): LEFT PAROTIDECTOMY  SURGEON: Beckie Salts, MD  ASSISTANTS: Jolene Provost PA  ANESTHESIA: General   EBL: 20 ml  DRAINS: 7 French round J-P  LOCAL MEDICATIONS USED: None  SPECIMEN: Left parotid  COUNTS: Correct  PROCEDURE DETAILS: The patient was taken to the operating room and placed on the operating table in the supine position. Following induction of general endotracheal anesthesia, the left face was prepped and draped in a standard fashion. A marking pen was used to outline the incision in the preauricular crease with extension around the mastoid. Electrocautery was used to incise the skin and subcutaneous tissue. Skin flap was developed anteriorly. The earlobe branch of the greater auricular nerve was identified and dissected and reflected posteriorly. The parotid gland was dissected forward off the sternocleidomastoid muscle and off the ear canal. The posterior belly of the digastric muscle was identified. The main trunk of the facial nerve was then identified. Using [facility], the nerve was dissected towards the Pes and then all of the upper and lower branches. Harmonic dissector was used to divide the parotid tissue overlying the nerve. The tumor was identified at the most inferior portion of the gland and was kept intact. The specimen was delivered and sent for pathologic evaluation. The facial nerve and all its branches were intact visually. A single 4-0 silk tie was used on the retromandibular vein. The wound was irrigated with saline. Hemostasis was completed with bipolar cautery. The drain was exited through a separate stab incision and secured in place with suture. A running subcuticular 4-0 chromic closure was accomplished and Dermabond was used on the  skin. The patient was awakened, extubated and transferred to recovery in stable condition.    PATIENT DISPOSITION: To PACU, stable

## 2014-09-26 NOTE — Anesthesia Postprocedure Evaluation (Signed)
  Anesthesia Post-op Note  Patient: Jeffery Downs  Procedure(s) Performed: Procedure(s): LEFT PAROTIDECTOMY (Left)  Patient Location: PACU  Anesthesia Type:General  Level of Consciousness: awake, alert  and oriented  Airway and Oxygen Therapy: Patient Spontanous Breathing  Post-op Pain: mild  Post-op Assessment: Post-op Vital signs reviewed              Post-op Vital Signs: Reviewed  Last Vitals:  Filed Vitals:   09/26/14 1130  BP: 138/85  Pulse: 77  Temp: 36.7 C  Resp: 20    Complications: No apparent anesthesia complications

## 2014-09-26 NOTE — Anesthesia Procedure Notes (Signed)
Procedure Name: Intubation Date/Time: 09/26/2014 8:38 AM Performed by: Lyndee Leo Pre-anesthesia Checklist: Patient identified, Emergency Drugs available, Suction available and Patient being monitored Patient Re-evaluated:Patient Re-evaluated prior to inductionOxygen Delivery Method: Circle System Utilized Preoxygenation: Pre-oxygenation with 100% oxygen Intubation Type: IV induction Ventilation: Mask ventilation without difficulty Laryngoscope Size: Miller, 3 and Glidescope Grade View: Grade IV Tube type: Oral Rae Tube size: 8.0 mm Number of attempts: 2 Airway Equipment and Method: Stylet,  Oral airway and Video-laryngoscopy Placement Confirmation: positive ETCO2 and breath sounds checked- equal and bilateral Secured at: 22 cm Tube secured with: Tape Dental Injury: Teeth and Oropharynx as per pre-operative assessment  Difficulty Due To: Difficulty was anticipated, Difficult Airway- due to anterior larynx and Difficult Airway- due to limited oral opening Future Recommendations: Recommend- induction with short-acting agent, and alternative techniques readily available

## 2014-09-27 DIAGNOSIS — D110 Benign neoplasm of parotid gland: Secondary | ICD-10-CM | POA: Diagnosis not present

## 2014-09-27 NOTE — Discharge Instructions (Signed)
It is okay to use soap and water on the incision but do not use any cream, oils or ointment. Call for signs of bleeding or infection, [phone] No strenuous activity x 2 weeks

## 2014-09-27 NOTE — Discharge Summary (Signed)
  09/27/2014 8:18 AM  Jeffery Downs [id number]  Post-Op Day 1, Discharge Summary    Temp:  [97.4 F (36.3 C)-98.3 F (36.8 C)] 97.4 F (36.3 C) (07/09 0620) Pulse Rate:  [65-98] 75 (07/09 0620) Resp:  [10-21] 16 (07/09 0620) BP: (105-138)/(58-85) 106/58 mmHg (07/09 0620) SpO2:  [91 %-100 %] 98 % (07/09 0620),     Intake/Output Summary (Last 24 hours) at 09/27/14 0818 Last data filed at 09/27/14 0630  Gross per 24 hour  Intake   2285 ml  Output 1942.5 ml  Net  342.5 ml   JP 10 ml  No results found for this or any previous visit (from the past 24 hour(s)).  SUBJECTIVE:  Min pain.  Eating, drinking. Voiding spontaneously.  Breathing well  OBJECTIVE:  Wound flat. Drain removed. Facial n intact.  Voice clear  IMPRESSION:  Satisfactory check  PLAN:  Discharge to home and care of family  Admit: 8 JUL Discharge: 9 JUL Final diagnosis:  LEFT parotid mass Procedure: LEFT superficial parotidectomy, 8 JUL Comp: none Cond: ambulatory, pain controlled, taking good po's, voiding Instructions written and given Rx's: per Dr. Kandy Garrison Course:  Underwent surgery on day of admission.  Observed overnight with minimal wound drainage.  Drain discontinued and discharged to home in care of family on POD 1.    Jodi Marble

## 2014-09-29 ENCOUNTER — Encounter (HOSPITAL_BASED_OUTPATIENT_CLINIC_OR_DEPARTMENT_OTHER): Payer: Self-pay | Admitting: Otolaryngology

## 2015-11-13 ENCOUNTER — Institutional Professional Consult (permissible substitution): Payer: Worker's Compensation | Admitting: Pulmonary Disease

## 2016-08-17 ENCOUNTER — Other Ambulatory Visit: Payer: Self-pay | Admitting: Urology

## 2016-09-09 ENCOUNTER — Encounter (HOSPITAL_BASED_OUTPATIENT_CLINIC_OR_DEPARTMENT_OTHER): Payer: Self-pay | Admitting: *Deleted

## 2016-09-09 NOTE — Progress Notes (Signed)
NPO AFTER MN.  ARRIVE AT 0830.  NEEDS HG.  WILL TAKE AM MEDS DOS W/ SIPS OF WATER AND BRING RESCUE INHALER.

## 2016-09-15 NOTE — Anesthesia Preprocedure Evaluation (Addendum)
Anesthesia Evaluation  Patient identified by MRN, date of birth, ID band Patient awake    Reviewed: Allergy & Precautions, NPO status , Patient's Chart, lab work & pertinent test results  Airway Mallampati: III  TM Distance: <3 FB Neck ROM: Full  Mouth opening: Limited Mouth Opening  Dental  (+) Implants,    Pulmonary neg pulmonary ROS, COPD,  COPD inhaler, former smoker,    Pulmonary exam normal breath sounds clear to auscultation       Cardiovascular negative cardio ROS Normal cardiovascular exam Rhythm:Regular Rate:Normal     Neuro/Psych negative neurological ROS  negative psych ROS   GI/Hepatic negative GI ROS, Neg liver ROS, GERD  Medicated,  Endo/Other  negative endocrine ROS  Renal/GU negative Renal ROS  negative genitourinary   Musculoskeletal negative musculoskeletal ROS (+)   Abdominal   Peds negative pediatric ROS (+)  Hematology negative hematology ROS (+)   Anesthesia Other Findings   Reproductive/Obstetrics negative OB ROS                           Anesthesia Physical Anesthesia Plan  ASA: II  Anesthesia Plan: General   Post-op Pain Management:    Induction: Intravenous  PONV Risk Score and Plan: 1 and Ondansetron and Dexamethasone  Airway Management Planned: LMA  Additional Equipment:   Intra-op Plan:   Post-operative Plan:   Informed Consent:   Plan Discussed with: CRNA and Surgeon  Anesthesia Plan Comments: ( )       Anesthesia Quick Evaluation

## 2016-09-15 NOTE — H&P (Signed)
HPI: Jeffery Downs is a 61 year-old male with a right hydrocele.  He first noticed his hydrocele 8 months ago. His hydrocele is on the right side. The patient has not had a scrotal ultrasound. He does have pain on the side of his hydrocele. His hydrocele does cause restriction of normal activities.   He has not had injuries to the testicles or scrotum. He has had the following surgeries: Hernia Repair. The patient denies having the following scrotal surgeries: Vasectomy, Hydrocele Repair, Undescended Testis Surgery, and Varicocele Repair. He has not had a testicular infection. His hydrocele does bother him enough to consider surgical repair.   11/24/15: He has had bilateral inguinal hernia surgery and said that this may have been present for longer than 2 weeks but has gradually increased in size. He has no voiding symptoms.   Interval history 08/16/16: He reports that his right hemiscrotal swelling has increased in size. It is also causing intermittent pain. This occurs when he bends over and this causes binding from his pants but also he will have what he describes as a fleeting sharp pain in the right side when he is seated. His work requires that he right in a vehicle that causes a lot of bouncing which is also uncomfortable. He denies any new voiding symptoms.      CC/HPI: He was seen on 11/09/15 at which time he reported a 1-2 week history of right testicular swelling that had gradually gotten worse. He denied any trauma. No voiding symptoms were noted. He has noticed more swelling since initial visit in September.     ALLERGIES: None   MEDICATIONS: Alprazolam  Citalopram Hbr  Clonazepam  Oxycodone-Acetaminophen  Pantoprazole Sodium     GU PSH: None   NON-GU PSH: Excise Parotid Gland/lesion Eye Surgery (Unspecified)    GU PMH: Hydrocele, Unspec, Right, He has a small, a symptomatically right hydrocele. We discussed the benign nature of this condition and the fact that if it becomes  bothersome in any way or increases in size then the treatment options would either be aspiration which has a high probability of recurrence versus hydrocelectomy with a much better mobility of long lasting resolution. It doesn't bother him enough to want to consider surgery and I told him to return to see me at any time if he had any worsening of this condition. He'll otherwise see me as needed. - 11/24/2015    NON-GU PMH: Depression Generalized anxiety disorder GERD Other hyperlipidemia    FAMILY HISTORY: 1 son - Runs in Family Dementia - Father Heart Disease - Father Kidney Stones - Runs in Family Strokes - Father   SOCIAL HISTORY: Marital Status: Married Current Smoking Status: Patient smokes. Has smoked since 11/20/1975. Smokes 1 pack per day.  Has never drank.  Drinks 4+ caffeinated drinks per day. Patient's occupation Therapist, music.    REVIEW OF SYSTEMS:    GU Review Male:   Patient reports frequent urination, burning/ pain with urination, and get up at night to urinate. Patient denies hard to postpone urination, leakage of urine, stream starts and stops, trouble starting your stream, have to strain to urinate , erection problems, and penile pain.  Gastrointestinal (Upper):   Patient denies nausea, vomiting, and indigestion/ heartburn.  Gastrointestinal (Lower):   Patient denies diarrhea and constipation.  Constitutional:   Patient denies fever, night sweats, weight loss, and fatigue.  Skin:   Patient denies skin rash/ lesion and itching.  Eyes:   Patient denies blurred vision and  double vision.  Ears/ Nose/ Throat:   Patient denies sore throat and sinus problems.  Hematologic/Lymphatic:   Patient denies swollen glands and easy bruising.  Cardiovascular:   Patient denies leg swelling and chest pains.  Respiratory:   Patient denies cough and shortness of breath.  Endocrine:   Patient denies excessive thirst.  Musculoskeletal:   Patient denies back pain and joint pain.   Neurological:   Patient denies headaches and dizziness.  Psychologic:   Patient denies anxiety and depression.   VITAL SIGNS:     Weight 191 lb / 86.64 kg  BP 122/84 mmHg  Pulse 85 /min   GU PHYSICAL EXAMINATION:    Scrotum: No lesions. No edema. No cysts. No warts.   Epididymides: Right: Nonpalpable Left: No spermatocele, no masses, no cysts, no tenderness, no induration, no enlargement.   Testes: He has a moderately large right hydrocele. His right testicle is nonpalpable due to surrounding fluid. Normal location left testis. Normal location right testis. No mass, no cyst, no varicocele, no hydrocele left testis. No mass, no cyst, no varicocele right testis.    MULTI-SYSTEM PHYSICAL EXAMINATION:    Constitutional: Well-nourished. No physical deformities. Normally developed. Good grooming.  Neck: Neck symmetrical, not swollen. Normal tracheal position.  Respiratory: No labored breathing, no use of accessory muscles.   Cardiovascular: Normal temperature, normal extremity pulses, no swelling, no varicosities.  Lymphatic: No enlargement of neck, axillae, groin.  Skin: No paleness, no jaundice, no cyanosis. No lesion, no ulcer, no rash.  Neurologic / Psychiatric: Oriented to time, oriented to place, oriented to person. No depression, no anxiety, no agitation.  Gastrointestinal: No mass, no tenderness, no rigidity, non obese abdomen.  Eyes: Normal conjunctivae. Normal eyelids.  Ears, Nose, Mouth, and Throat: Left ear no scars, no lesions, no masses. Right ear no scars, no lesions, no masses. Nose no scars, no lesions, no masses. Normal hearing. Normal lips.  Musculoskeletal: Normal gait and station of head and neck.        PAST DATA REVIEWED:  Source Of History:  Patient  Records Review:   Previous Patient Records, POC Tool   PROCEDURES:          Urinalysis - 81003 Dipstick Dipstick Cont'd  Color: Yellow Bilirubin: Neg  Appearance: Clear Ketones: Neg  Specific Gravity: 1.010  Blood: Neg  pH: 6.0 Protein: Neg  Glucose: Neg Urobilinogen: 0.2    Nitrites: Neg    Leukocyte Esterase: Neg       ASSESSMENT/PLAN:      ICD-10 Details  1 GU:   Hydrocele, Unspec - N43.3 Right, Worsening - He has an enlarging, symptomatic right hydrocele and therefore we discussed the options which be continued observation, aspiration with a high probability of recurrence and hydrocelectomy. He was interested in a hydrocelectomy and therefore I have discussed incision used, the outpatient nature of the procedure. It's risks and potential complications, the probability of success as well as the anticipated postoperative course. He understands and is elected to proceed.

## 2016-09-16 ENCOUNTER — Ambulatory Visit (HOSPITAL_BASED_OUTPATIENT_CLINIC_OR_DEPARTMENT_OTHER)
Admission: RE | Admit: 2016-09-16 | Discharge: 2016-09-16 | Disposition: A | Discharge status: Home / Self Care | Payer: 59 | Source: Ambulatory Visit | Attending: Urology | Admitting: Urology

## 2016-09-16 ENCOUNTER — Ambulatory Visit (HOSPITAL_BASED_OUTPATIENT_CLINIC_OR_DEPARTMENT_OTHER): Payer: 59 | Admitting: Anesthesiology

## 2016-09-16 ENCOUNTER — Encounter (HOSPITAL_BASED_OUTPATIENT_CLINIC_OR_DEPARTMENT_OTHER): Payer: Self-pay | Admitting: Anesthesiology

## 2016-09-16 ENCOUNTER — Encounter (HOSPITAL_BASED_OUTPATIENT_CLINIC_OR_DEPARTMENT_OTHER)
Admission: RE | Disposition: A | Discharge status: Home / Self Care | Payer: Self-pay | Source: Ambulatory Visit | Attending: Urology

## 2016-09-16 DIAGNOSIS — Z818 Family history of other mental and behavioral disorders: Secondary | ICD-10-CM | POA: Insufficient documentation

## 2016-09-16 DIAGNOSIS — E784 Other hyperlipidemia: Secondary | ICD-10-CM | POA: Insufficient documentation

## 2016-09-16 DIAGNOSIS — K219 Gastro-esophageal reflux disease without esophagitis: Secondary | ICD-10-CM | POA: Insufficient documentation

## 2016-09-16 DIAGNOSIS — Z8249 Family history of ischemic heart disease and other diseases of the circulatory system: Secondary | ICD-10-CM | POA: Diagnosis not present

## 2016-09-16 DIAGNOSIS — F411 Generalized anxiety disorder: Secondary | ICD-10-CM | POA: Diagnosis not present

## 2016-09-16 DIAGNOSIS — F329 Major depressive disorder, single episode, unspecified: Secondary | ICD-10-CM | POA: Diagnosis not present

## 2016-09-16 DIAGNOSIS — Z823 Family history of stroke: Secondary | ICD-10-CM | POA: Insufficient documentation

## 2016-09-16 DIAGNOSIS — Z79899 Other long term (current) drug therapy: Secondary | ICD-10-CM | POA: Insufficient documentation

## 2016-09-16 DIAGNOSIS — N433 Hydrocele, unspecified: Secondary | ICD-10-CM | POA: Diagnosis present

## 2016-09-16 DIAGNOSIS — Z841 Family history of disorders of kidney and ureter: Secondary | ICD-10-CM | POA: Insufficient documentation

## 2016-09-16 DIAGNOSIS — F17210 Nicotine dependence, cigarettes, uncomplicated: Secondary | ICD-10-CM | POA: Diagnosis not present

## 2016-09-16 HISTORY — PX: HYDROCELE EXCISION: SHX482

## 2016-09-16 HISTORY — DX: Acquired absence of other specified parts of digestive tract: Z90.49

## 2016-09-16 HISTORY — DX: Generalized anxiety disorder: F41.1

## 2016-09-16 HISTORY — DX: Hydrocele, unspecified: N43.3

## 2016-09-16 HISTORY — DX: Chronic obstructive pulmonary disease, unspecified: J44.9

## 2016-09-16 HISTORY — DX: Male erectile dysfunction, unspecified: N52.9

## 2016-09-16 HISTORY — DX: Presence of spectacles and contact lenses: Z97.3

## 2016-09-16 LAB — POCT HEMOGLOBIN-HEMACUE: Hemoglobin: 16.7 g/dL (ref 13.0–17.0)

## 2016-09-16 SURGERY — HYDROCELECTOMY
Anesthesia: General | Site: Scrotum | Laterality: Right

## 2016-09-16 MED ORDER — PROPOFOL 10 MG/ML IV BOLUS
INTRAVENOUS | Status: DC | PRN
Start: 2016-09-16 — End: 2016-09-16
  Administered 2016-09-16: 200 mg via INTRAVENOUS

## 2016-09-16 MED ORDER — LIDOCAINE 2% (20 MG/ML) 5 ML SYRINGE
INTRAMUSCULAR | Status: DC | PRN
  Administered 2016-09-16: 60 mg via INTRAVENOUS

## 2016-09-16 MED ORDER — KETOROLAC TROMETHAMINE 30 MG/ML IJ SOLN
INTRAMUSCULAR | Status: AC
Start: 2016-09-16 — End: 2016-09-16
  Filled 2016-09-16: qty 1

## 2016-09-16 MED ORDER — BUPIVACAINE-EPINEPHRINE 0.5% -1:200000 IJ SOLN
INTRAMUSCULAR | Status: DC | PRN
  Administered 2016-09-16: 10 mL

## 2016-09-16 MED ORDER — ONDANSETRON HCL 4 MG/2ML IJ SOLN
INTRAMUSCULAR | Status: DC | PRN
  Administered 2016-09-16: 4 mg via INTRAVENOUS

## 2016-09-16 MED ORDER — DEXAMETHASONE SODIUM PHOSPHATE 4 MG/ML IJ SOLN
INTRAMUSCULAR | Status: DC | PRN
  Administered 2016-09-16: 10 mg via INTRAVENOUS

## 2016-09-16 MED ORDER — HYDROCODONE-ACETAMINOPHEN 5-325 MG PO TABS
ORAL_TABLET | ORAL | Status: AC
  Filled 2016-09-16: qty 1

## 2016-09-16 MED ORDER — FENTANYL CITRATE (PF) 100 MCG/2ML IJ SOLN
INTRAMUSCULAR | Status: DC | PRN
  Administered 2016-09-16 (×4): 25 ug via INTRAVENOUS
  Administered 2016-09-16: 50 ug via INTRAVENOUS
  Administered 2016-09-16 (×2): 25 ug via INTRAVENOUS

## 2016-09-16 MED ORDER — HYDROCODONE-ACETAMINOPHEN 5-325 MG PO TABS
1.0000 | ORAL_TABLET | Freq: Four times a day (QID) | ORAL | Status: DC | PRN
  Administered 2016-09-16: 1 via ORAL
  Filled 2016-09-16: qty 1

## 2016-09-16 MED ORDER — MIDAZOLAM HCL 2 MG/2ML IJ SOLN
INTRAMUSCULAR | Status: AC
  Filled 2016-09-16: qty 2

## 2016-09-16 MED ORDER — HYDROCODONE-ACETAMINOPHEN 7.5-325 MG PO TABS
1.0000 | ORAL_TABLET | Freq: Four times a day (QID) | ORAL | Status: DC | PRN
  Filled 2016-09-16: qty 1

## 2016-09-16 MED ORDER — ONDANSETRON HCL 4 MG/2ML IJ SOLN
INTRAMUSCULAR | Status: AC
  Filled 2016-09-16: qty 2

## 2016-09-16 MED ORDER — TRIPLE ANTIBIOTIC 3.5-400-5000 EX OINT
TOPICAL_OINTMENT | CUTANEOUS | Status: DC | PRN
  Administered 2016-09-16: 1

## 2016-09-16 MED ORDER — HYDROCODONE-ACETAMINOPHEN 5-325 MG PO TABS: 1.0000 | ORAL_TABLET | Freq: Four times a day (QID) | ORAL | 0 refills | Status: DC | PRN

## 2016-09-16 MED ORDER — MEPERIDINE HCL 25 MG/ML IJ SOLN
6.2500 mg | INTRAMUSCULAR | Status: DC | PRN
  Filled 2016-09-16: qty 1

## 2016-09-16 MED ORDER — PROPOFOL 10 MG/ML IV BOLUS
INTRAVENOUS | Status: AC
  Filled 2016-09-16: qty 20

## 2016-09-16 MED ORDER — FENTANYL CITRATE (PF) 100 MCG/2ML IJ SOLN
25.0000 ug | INTRAMUSCULAR | Status: DC | PRN
  Filled 2016-09-16: qty 1

## 2016-09-16 MED ORDER — LIDOCAINE 2% (20 MG/ML) 5 ML SYRINGE
INTRAMUSCULAR | Status: AC
  Filled 2016-09-16: qty 5

## 2016-09-16 MED ORDER — ONDANSETRON HCL 4 MG/2ML IJ SOLN
4.0000 mg | Freq: Once | INTRAMUSCULAR | Status: DC | PRN
  Filled 2016-09-16: qty 2

## 2016-09-16 MED ORDER — CEFAZOLIN SODIUM-DEXTROSE 2-4 GM/100ML-% IV SOLN
INTRAVENOUS | Status: AC
  Filled 2016-09-16: qty 100

## 2016-09-16 MED ORDER — FENTANYL CITRATE (PF) 100 MCG/2ML IJ SOLN
INTRAMUSCULAR | Status: AC
  Filled 2016-09-16: qty 2

## 2016-09-16 MED ORDER — KETOROLAC TROMETHAMINE 30 MG/ML IJ SOLN
INTRAMUSCULAR | Status: DC | PRN
  Administered 2016-09-16: 30 mg via INTRAVENOUS

## 2016-09-16 MED ORDER — LACTATED RINGERS IV SOLN
INTRAVENOUS | Status: DC
  Administered 2016-09-16: 09:00:00 via INTRAVENOUS
  Filled 2016-09-16: qty 1000

## 2016-09-16 MED ORDER — DEXAMETHASONE SODIUM PHOSPHATE 10 MG/ML IJ SOLN
INTRAMUSCULAR | Status: AC
  Filled 2016-09-16: qty 1

## 2016-09-16 MED ORDER — CEFAZOLIN SODIUM-DEXTROSE 2-4 GM/100ML-% IV SOLN
2.0000 g | INTRAVENOUS | Status: AC
  Administered 2016-09-16: 2 g via INTRAVENOUS
  Filled 2016-09-16: qty 100

## 2016-09-16 MED ORDER — MIDAZOLAM HCL 5 MG/5ML IJ SOLN
INTRAMUSCULAR | Status: DC | PRN
  Administered 2016-09-16: 2 mg via INTRAVENOUS

## 2016-09-16 SURGICAL SUPPLY — 45 items
BLADE CLIPPER SURG (BLADE) IMPLANT
BLADE SURG 15 STRL LF DISP TIS (BLADE) ×1 IMPLANT
BLADE SURG 15 STRL SS (BLADE) ×1
BNDG GAUZE ELAST 4 BULKY (GAUZE/BANDAGES/DRESSINGS) ×2 IMPLANT
CANISTER SUCT 3000ML PPV (MISCELLANEOUS) IMPLANT
CANISTER SUCTION 1200CC (MISCELLANEOUS) IMPLANT
CLEANER CAUTERY TIP 5X5 PAD (MISCELLANEOUS) IMPLANT
COVER BACK TABLE 60X90IN (DRAPES) ×2 IMPLANT
COVER MAYO STAND STRL (DRAPES) ×2 IMPLANT
DISSECTOR ROUND CHERRY 3/8 STR (MISCELLANEOUS) IMPLANT
DRAIN PENROSE 18X1/4 LTX STRL (WOUND CARE) IMPLANT
DRAPE LAPAROTOMY 100X72 PEDS (DRAPES) ×2 IMPLANT
ELECT REM PT RETURN 9FT ADLT (ELECTROSURGICAL) ×2
ELECTRODE REM PT RTRN 9FT ADLT (ELECTROSURGICAL) ×1 IMPLANT
GLOVE BIO SURGEON STRL SZ7 (GLOVE) ×2 IMPLANT
GLOVE BIO SURGEON STRL SZ8 (GLOVE) ×2 IMPLANT
GLOVE BIOGEL PI IND STRL 7.0 (GLOVE) ×1 IMPLANT
GLOVE BIOGEL PI IND STRL 7.5 (GLOVE) ×1 IMPLANT
GLOVE BIOGEL PI INDICATOR 7.0 (GLOVE) ×1
GLOVE BIOGEL PI INDICATOR 7.5 (GLOVE) ×1
GLOVE ECLIPSE 7.0 STRL STRAW (GLOVE) ×2 IMPLANT
GOWN STRL REUS W/ TWL LRG LVL3 (GOWN DISPOSABLE) IMPLANT
GOWN STRL REUS W/ TWL XL LVL3 (GOWN DISPOSABLE) ×1 IMPLANT
GOWN STRL REUS W/TWL LRG LVL3 (GOWN DISPOSABLE) ×2 IMPLANT
GOWN STRL REUS W/TWL XL LVL3 (GOWN DISPOSABLE) ×5 IMPLANT
KIT RM TURNOVER CYSTO AR (KITS) ×2 IMPLANT
NEEDLE HYPO 25X1 1.5 SAFETY (NEEDLE) IMPLANT
NS IRRIG 500ML POUR BTL (IV SOLUTION) ×2 IMPLANT
PACK BASIN DAY SURGERY FS (CUSTOM PROCEDURE TRAY) ×2 IMPLANT
PAD CLEANER CAUTERY TIP 5X5 (MISCELLANEOUS)
PENCIL BUTTON HOLSTER BLD 10FT (ELECTRODE) ×2 IMPLANT
SUPPORT SCROTAL LG STRP (MISCELLANEOUS) ×2 IMPLANT
SUT CHROMIC 3 0 SH 27 (SUTURE) ×4 IMPLANT
SUT ETHILON 3 0 PS 1 (SUTURE) IMPLANT
SUT SILK 4 0 TIES 17X18 (SUTURE) IMPLANT
SUT VIC AB 4-0 RB1 27 (SUTURE)
SUT VIC AB 4-0 RB1 27X BRD (SUTURE) IMPLANT
SUT VICRYL 6 0 RB 1 (SUTURE) IMPLANT
SYR CONTROL 10ML LL (SYRINGE) IMPLANT
TOWEL OR 17X24 6PK STRL BLUE (TOWEL DISPOSABLE) ×4 IMPLANT
TRAY DSU PREP LF (CUSTOM PROCEDURE TRAY) ×2 IMPLANT
TUBE CONNECTING 12X1/4 (SUCTIONS) ×2 IMPLANT
WATER STERILE IRR 3000ML UROMA (IV SOLUTION) IMPLANT
WATER STERILE IRR 500ML POUR (IV SOLUTION) ×2 IMPLANT
YANKAUER SUCT BULB TIP NO VENT (SUCTIONS) ×2 IMPLANT

## 2016-09-16 NOTE — Discharge Instructions (Signed)
Scrotal surgery postoperative instructions ° °Wound: ° °In most cases your incision will have absorbable sutures that will dissolve within the first 10-20 days. Some will fall out even earlier. Expect some redness as the sutures dissolved but this should occur only around the sutures. If there is generalized redness, especially with increasing pain or swelling, let us know. The scrotum will very likely get "black and blue" as the blood in the tissues spread. Sometimes the whole scrotum will turn colors. The black and blue is followed by a yellow and brown color. In time, all the discoloration will go away. In some cases some firm swelling in the area of the testicle may persist for up to 4-6 weeks after the surgery and is considered normal in most cases. ° °Diet: ° °You may return to your normal diet within 24 hours following your surgery. You may note some mild nausea and possibly vomiting the first 6-8 hours following surgery. This is usually due to the side effects of anesthesia, and will disappear quite soon. I would suggest clear liquids and a very light meal the first evening following your surgery. ° °Activity: ° °Your physical activity should be restricted the first 48 hours. During that time you should remain relatively inactive, moving about only when necessary. During the first 7-10 days following surgery he should avoid lifting any heavy objects (anything greater than 15 pounds), and avoid strenuous exercise. If you work, ask us specifically about your restrictions, both for work and home. We will write a note to your employer if needed. ° °You should plan to wear a tight pair of jockey shorts or an athletic supporter for the first 4-5 days, even to sleep. This will keep the scrotum immobilized to some degree and keep the swelling down. ° °Ice packs should be placed on and off over the scrotum for the first 48 hours. Frozen peas or corn in a ZipLock bag can be frozen, used and re-frozen. Fifteen minutes  on and 15 minutes off is a reasonable schedule. The ice is a good pain reliever and keeps the swelling down. ° °Hygiene: ° °You may shower 48 hours after your surgery. Tub bathing should be restricted until the seventh day. ° °Medication: ° °You will be sent home with some type of pain medication. In many cases you will be sent home with a narcotic pain pill (hydrococone or oxycodone). If the pain is not too bad, you may take either Tylenol (acetaminophen) or Advil (ibuprofen) which contain no narcotic agents, and might be tolerated a little better, with fewer side effects. If the pain medication you are sent home with does not control the pain, you will have to let us know. Some narcotic pain medications cannot be given or refilled by a phone call to a pharmacy. ° °Problems you should report to us: ° °· Fever of 101.0 degrees Fahrenheit or greater. °· Moderate or severe swelling under the skin incision or involving the scrotum. °· Drug reaction such as hives, a rash, nausea or vomiting. ° ° °Post Anesthesia Home Care Instructions ° °Activity: °Get plenty of rest for the remainder of the day. A responsible individual must stay with you for 24 hours following the procedure.  °For the next 24 hours, DO NOT: °-Drive a car °-Operate machinery °-Drink alcoholic beverages °-Take any medication unless instructed by your physician °-Make any legal decisions or sign important papers. ° °Meals: °Start with liquid foods such as gelatin or soup. Progress to regular foods as tolerated. Avoid   greasy, spicy, heavy foods. If nausea and/or vomiting occur, drink only clear liquids until the nausea and/or vomiting subsides. Call your physician if vomiting continues. ° °Special Instructions/Symptoms: °Your throat may feel dry or sore from the anesthesia or the breathing tube placed in your throat during surgery. If this causes discomfort, gargle with warm salt water. The discomfort should disappear within 24 hours. ° °If you had a  scopolamine patch placed behind your ear for the management of post- operative nausea and/or vomiting: ° °1. The medication in the patch is effective for 72 hours, after which it should be removed.  Wrap patch in a tissue and discard in the trash. Wash hands thoroughly with soap and water. °2. You may remove the patch earlier than 72 hours if you experience unpleasant side effects which may include dry mouth, dizziness or visual disturbances. °3. Avoid touching the patch. Wash your hands with soap and water after contact with the patch. °  °

## 2016-09-16 NOTE — Op Note (Signed)
UROLOGY OPERATIVE NOTE   PATIENT:  Jeffery Downs  PRE-OPERATIVE DIAGNOSIS: Right hydrocele   POST-OPERATIVE DIAGNOSIS:  Same  PROCEDURE:  Procedure(s):  Right hydrocelectomy  SURGEON:  Liliane Shi  RESIDENT ASSISTANT: Jonna Clark, MD   INDICATION: 61 yo M w/ right hydrocele causing pain and restrictions of normal activity. Patient presents today for right hydrocelectomy. Risks and benefits of procedure were discussed, and patint's informed consent was obtained.   ANESTHESIA:  General via LMA  EBL:  Minimal  DRAINS: None  LOCAL MEDICATIONS USED:  0.25% marcaine - 10cc   SPECIMEN:  None  DISPOSITION OF SPECIMEN:  N/A  Description of procedure: After informed consent the patient was brought to the major OR, placed on the table and administered general anesthesia. His genitalia was then sterilely prepped and draped. An official timeout was then performed.  A midline median raphae scrotal incision was then made and carried down over the right hydrocele. The tissue over the hydrocele was cleared using a combination of sharp and blunt technique. The hydrocele was then opened, drained of clear amber fluid and delivered through the incision. The excess hydrocele sac tissue was excised with the Bovie electrocautery and then I cauterized the edges. I then reapproximated the edges posteriorly behind the epididymis with a running, locking 3-0 chromic suture.   His testicle was then replaced in the normal anatomic position in  his right hemiscrotum. I then closed the deep scrotal tissue with running 3-0 chromic suture in a locking fashion. I injected 0.25% Marcaine in the subcutaneous tissue and closed the skin with running 3-0 chromic. Neosporin, a sterile gauze dressing, fluff Kerlix and a scrotal support were applied. The patient tolerated the procedure well no intraoperative complications. Needle sponge and instrument counts were correct at the end of the operation.   PLAN OF  CARE: Discharge to home after PACU  PATIENT DISPOSITION:  PACU - hemodynamically stable.  I was present all aspects of the operation permit start to completion.

## 2016-09-16 NOTE — Transfer of Care (Signed)
Immediate Anesthesia Transfer of Care Note  Patient: Jeffery Downs  Procedure(s) Performed: Procedure(s) (LRB): RIGHT HYDROCELECTOMY ADULT (Right)  Patient Location: PACU  Anesthesia Type: General  Level of Consciousness: awake, sedated, patient cooperative and responds to stimulation  Airway & Oxygen Therapy: Patient Spontanous Breathing and Patient connected to [place] O2  Post-op Assessment: Report given to PACU RN, Post -op Vital signs reviewed and stable and Patient moving all extremities  Post vital signs: Reviewed and stable  Complications: No apparent anesthesia complications

## 2016-09-16 NOTE — Anesthesia Procedure Notes (Signed)
Procedure Name: LMA Insertion Date/Time: 09/16/2016 9:49 AM Performed by: Justice Rocher Pre-anesthesia Checklist: Patient identified, Emergency Drugs available, Suction available and Patient being monitored Patient Re-evaluated:Patient Re-evaluated prior to inductionOxygen Delivery Method: Circle system utilized Preoxygenation: Pre-oxygenation with 100% oxygen Intubation Type: IV induction Ventilation: Mask ventilation without difficulty LMA: LMA inserted LMA Size: 4.0 Number of attempts: 1 Airway Equipment and Method: Bite block Placement Confirmation: positive ETCO2 and breath sounds checked- equal and bilateral Tube secured with: Tape Dental Injury: Teeth and Oropharynx as per pre-operative assessment  Comments: Airway managed induction by Denna Haggard CRNA and Dr Ambrose Pancoast, MDA

## 2016-09-16 NOTE — Anesthesia Postprocedure Evaluation (Signed)
Anesthesia Post Note  Patient: Jeffery Downs  Procedure(s) Performed: Procedure(s) (LRB): RIGHT HYDROCELECTOMY ADULT (Right)     Anesthesia Post Evaluation  Last Vitals:  Vitals:   09/16/16 1100 09/16/16 1115  BP: 134/78 127/75  Pulse: (!) 57 61  Resp: 18 14  Temp:      Last Pain:  Vitals:   09/16/16 0845  TempSrc: Oral                 Jacque Byron

## 2016-09-19 ENCOUNTER — Encounter (HOSPITAL_BASED_OUTPATIENT_CLINIC_OR_DEPARTMENT_OTHER): Payer: Self-pay | Admitting: Urology

## 2017-07-03 ENCOUNTER — Emergency Department (HOSPITAL_COMMUNITY): Payer: 59

## 2017-07-03 ENCOUNTER — Encounter (HOSPITAL_COMMUNITY): Payer: Self-pay

## 2017-07-03 ENCOUNTER — Other Ambulatory Visit: Payer: Self-pay

## 2017-07-03 ENCOUNTER — Inpatient Hospital Stay (HOSPITAL_COMMUNITY)
Admission: EM | Admit: 2017-07-03 | Discharge: 2017-07-06 | DRG: 872 | Disposition: A | Discharge status: Home / Self Care | Payer: 59 | Attending: Internal Medicine | Admitting: Internal Medicine

## 2017-07-03 DIAGNOSIS — E876 Hypokalemia: Secondary | ICD-10-CM | POA: Diagnosis present

## 2017-07-03 DIAGNOSIS — J449 Chronic obstructive pulmonary disease, unspecified: Secondary | ICD-10-CM | POA: Diagnosis present

## 2017-07-03 DIAGNOSIS — F329 Major depressive disorder, single episode, unspecified: Secondary | ICD-10-CM | POA: Diagnosis present

## 2017-07-03 DIAGNOSIS — R509 Fever, unspecified: Secondary | ICD-10-CM | POA: Diagnosis not present

## 2017-07-03 DIAGNOSIS — K5792 Diverticulitis of intestine, part unspecified, without perforation or abscess without bleeding: Secondary | ICD-10-CM | POA: Diagnosis present

## 2017-07-03 DIAGNOSIS — K219 Gastro-esophageal reflux disease without esophagitis: Secondary | ICD-10-CM | POA: Diagnosis present

## 2017-07-03 DIAGNOSIS — Z8249 Family history of ischemic heart disease and other diseases of the circulatory system: Secondary | ICD-10-CM

## 2017-07-03 DIAGNOSIS — F411 Generalized anxiety disorder: Secondary | ICD-10-CM | POA: Diagnosis present

## 2017-07-03 DIAGNOSIS — N179 Acute kidney failure, unspecified: Secondary | ICD-10-CM

## 2017-07-03 DIAGNOSIS — I959 Hypotension, unspecified: Secondary | ICD-10-CM | POA: Diagnosis present

## 2017-07-03 DIAGNOSIS — N320 Bladder-neck obstruction: Secondary | ICD-10-CM | POA: Diagnosis present

## 2017-07-03 DIAGNOSIS — M1990 Unspecified osteoarthritis, unspecified site: Secondary | ICD-10-CM | POA: Diagnosis present

## 2017-07-03 DIAGNOSIS — A419 Sepsis, unspecified organism: Secondary | ICD-10-CM | POA: Diagnosis not present

## 2017-07-03 DIAGNOSIS — N182 Chronic kidney disease, stage 2 (mild): Secondary | ICD-10-CM | POA: Diagnosis present

## 2017-07-03 DIAGNOSIS — K5732 Diverticulitis of large intestine without perforation or abscess without bleeding: Secondary | ICD-10-CM | POA: Diagnosis not present

## 2017-07-03 DIAGNOSIS — E785 Hyperlipidemia, unspecified: Secondary | ICD-10-CM | POA: Diagnosis present

## 2017-07-03 DIAGNOSIS — Z803 Family history of malignant neoplasm of breast: Secondary | ICD-10-CM | POA: Diagnosis not present

## 2017-07-03 DIAGNOSIS — F17210 Nicotine dependence, cigarettes, uncomplicated: Secondary | ICD-10-CM | POA: Diagnosis present

## 2017-07-03 DIAGNOSIS — J40 Bronchitis, not specified as acute or chronic: Secondary | ICD-10-CM | POA: Diagnosis present

## 2017-07-03 DIAGNOSIS — J209 Acute bronchitis, unspecified: Secondary | ICD-10-CM | POA: Diagnosis not present

## 2017-07-03 DIAGNOSIS — J440 Chronic obstructive pulmonary disease with acute lower respiratory infection: Secondary | ICD-10-CM | POA: Diagnosis not present

## 2017-07-03 DIAGNOSIS — K760 Fatty (change of) liver, not elsewhere classified: Secondary | ICD-10-CM | POA: Diagnosis present

## 2017-07-03 DIAGNOSIS — N289 Disorder of kidney and ureter, unspecified: Secondary | ICD-10-CM

## 2017-07-03 DIAGNOSIS — J438 Other emphysema: Secondary | ICD-10-CM | POA: Diagnosis not present

## 2017-07-03 DIAGNOSIS — F17200 Nicotine dependence, unspecified, uncomplicated: Secondary | ICD-10-CM | POA: Diagnosis not present

## 2017-07-03 LAB — I-STAT CHEM 8, ED
BUN: 13 mg/dL (ref 6–20)
Calcium, Ion: 1.02 mmol/L — ABNORMAL LOW (ref 1.15–1.40)
Chloride: 99 mmol/L — ABNORMAL LOW (ref 101–111)
Creatinine, Ser: 1.7 mg/dL — ABNORMAL HIGH (ref 0.61–1.24)
Glucose, Bld: 144 mg/dL — ABNORMAL HIGH (ref 65–99)
HEMATOCRIT: 49 % (ref 39.0–52.0)
Hemoglobin: 16.7 g/dL (ref 13.0–17.0)
POTASSIUM: 3.3 mmol/L — AB (ref 3.5–5.1)
SODIUM: 134 mmol/L — AB (ref 135–145)
TCO2: 21 mmol/L — AB (ref 22–32)

## 2017-07-03 LAB — CBC WITH DIFFERENTIAL/PLATELET
BASOS ABS: 0.1 10*3/uL (ref 0.0–0.1)
Basophils Relative: 0 %
Eosinophils Absolute: 0 10*3/uL (ref 0.0–0.7)
Eosinophils Relative: 0 %
HEMATOCRIT: 46.8 % (ref 39.0–52.0)
HEMOGLOBIN: 16.1 g/dL (ref 13.0–17.0)
LYMPHS ABS: 1.8 10*3/uL (ref 0.7–4.0)
LYMPHS PCT: 12 %
MCH: 33.6 pg (ref 26.0–34.0)
MCHC: 34.4 g/dL (ref 30.0–36.0)
MCV: 97.7 fL (ref 78.0–100.0)
Monocytes Absolute: 1.2 10*3/uL — ABNORMAL HIGH (ref 0.1–1.0)
Monocytes Relative: 7 %
NEUTROS ABS: 12.4 10*3/uL — AB (ref 1.7–7.7)
NEUTROS PCT: 81 %
Platelets: 267 10*3/uL (ref 150–400)
RBC: 4.79 MIL/uL (ref 4.22–5.81)
RDW: 14 % (ref 11.5–15.5)
WBC: 15.5 10*3/uL — AB (ref 4.0–10.5)

## 2017-07-03 LAB — COMPREHENSIVE METABOLIC PANEL
ALBUMIN: 3.8 g/dL (ref 3.5–5.0)
ALK PHOS: 65 U/L (ref 38–126)
ALT: 10 U/L — ABNORMAL LOW (ref 17–63)
AST: 19 U/L (ref 15–41)
Anion gap: 12 (ref 5–15)
BUN: 14 mg/dL (ref 6–20)
CALCIUM: 8.7 mg/dL — AB (ref 8.9–10.3)
CO2: 20 mmol/L — AB (ref 22–32)
CREATININE: 1.67 mg/dL — AB (ref 0.61–1.24)
Chloride: 101 mmol/L (ref 101–111)
GFR calc Af Amer: 50 mL/min — ABNORMAL LOW (ref >60)
GFR calc non Af Amer: 43 mL/min — ABNORMAL LOW (ref >60)
GLUCOSE: 146 mg/dL — AB (ref 65–99)
Potassium: 3.3 mmol/L — ABNORMAL LOW (ref 3.5–5.1)
SODIUM: 133 mmol/L — AB (ref 135–145)
TOTAL PROTEIN: 7.8 g/dL (ref 6.5–8.1)
Total Bilirubin: 0.7 mg/dL (ref 0.3–1.2)

## 2017-07-03 LAB — INFLUENZA PANEL BY PCR (TYPE A & B)
Influenza A By PCR: NEGATIVE
Influenza B By PCR: NEGATIVE

## 2017-07-03 LAB — URINALYSIS, ROUTINE W REFLEX MICROSCOPIC
Bilirubin Urine: NEGATIVE
Glucose, UA: NEGATIVE mg/dL
Hgb urine dipstick: NEGATIVE
Ketones, ur: NEGATIVE mg/dL
Leukocytes, UA: NEGATIVE
NITRITE: NEGATIVE
PH: 6 (ref 5.0–8.0)
Protein, ur: NEGATIVE mg/dL
SPECIFIC GRAVITY, URINE: 1.023 (ref 1.005–1.030)

## 2017-07-03 LAB — CK: Total CK: 89 U/L (ref 49–397)

## 2017-07-03 LAB — I-STAT CG4 LACTIC ACID, ED: Lactic Acid, Venous: 1.1 mmol/L (ref 0.5–1.9)

## 2017-07-03 MED ORDER — HYDROCODONE-ACETAMINOPHEN 5-325 MG PO TABS
1.0000 | ORAL_TABLET | ORAL | Status: DC | PRN
  Administered 2017-07-04 (×2): 2 via ORAL
  Filled 2017-07-03 (×2): qty 2

## 2017-07-03 MED ORDER — GUAIFENESIN ER 600 MG PO TB12: 600.0000 mg | ORAL_TABLET | Freq: Two times a day (BID) | ORAL | Status: DC | PRN

## 2017-07-03 MED ORDER — PIPERACILLIN-TAZOBACTAM 3.375 G IVPB: 3.3750 g | Freq: Three times a day (TID) | INTRAVENOUS | Status: DC

## 2017-07-03 MED ORDER — SODIUM CHLORIDE 0.9 % IV BOLUS
1000.0000 mL | Freq: Once | INTRAVENOUS | Status: AC
Start: 2017-07-03 — End: 2017-07-03
  Administered 2017-07-03: 1000 mL via INTRAVENOUS

## 2017-07-03 MED ORDER — PANTOPRAZOLE SODIUM 40 MG PO TBEC
40.0000 mg | DELAYED_RELEASE_TABLET | Freq: Every morning | ORAL | Status: DC
  Administered 2017-07-04 – 2017-07-06 (×3): 40 mg via ORAL
  Filled 2017-07-03 (×3): qty 1

## 2017-07-03 MED ORDER — IOPAMIDOL (ISOVUE-300) INJECTION 61%
80.0000 mL | Freq: Once | INTRAVENOUS | Status: AC | PRN
  Administered 2017-07-03: 80 mL via INTRAVENOUS

## 2017-07-03 MED ORDER — ONDANSETRON HCL 4 MG PO TABS: 4.0000 mg | ORAL_TABLET | Freq: Four times a day (QID) | ORAL | Status: DC | PRN

## 2017-07-03 MED ORDER — PIPERACILLIN-TAZOBACTAM 3.375 G IVPB 30 MIN
3.3750 g | Freq: Once | INTRAVENOUS | Status: AC
  Administered 2017-07-03: 3.375 g via INTRAVENOUS
  Filled 2017-07-03: qty 50

## 2017-07-03 MED ORDER — ONDANSETRON HCL 4 MG/2ML IJ SOLN: 4.0000 mg | Freq: Four times a day (QID) | INTRAMUSCULAR | Status: DC | PRN

## 2017-07-03 MED ORDER — SODIUM CHLORIDE 0.9 % IV SOLN
INTRAVENOUS | Status: DC
  Administered 2017-07-03: 22:00:00 via INTRAVENOUS

## 2017-07-03 MED ORDER — PIPERACILLIN-TAZOBACTAM 3.375 G IVPB
3.3750 g | Freq: Three times a day (TID) | INTRAVENOUS | Status: DC
  Administered 2017-07-03 – 2017-07-04 (×2): 3.375 g via INTRAVENOUS
  Filled 2017-07-03 (×2): qty 50

## 2017-07-03 MED ORDER — SODIUM CHLORIDE 0.9 % IV BOLUS
1000.0000 mL | Freq: Once | INTRAVENOUS | Status: AC
  Administered 2017-07-03: 1000 mL via INTRAVENOUS

## 2017-07-03 MED ORDER — BISACODYL 5 MG PO TBEC: 5.0000 mg | DELAYED_RELEASE_TABLET | Freq: Every day | ORAL | Status: DC | PRN

## 2017-07-03 MED ORDER — ALPRAZOLAM 1 MG PO TABS
1.0000 mg | ORAL_TABLET | Freq: Every evening | ORAL | Status: DC | PRN
  Administered 2017-07-04 – 2017-07-05 (×2): 1 mg via ORAL
  Filled 2017-07-03 (×2): qty 1

## 2017-07-03 MED ORDER — IOPAMIDOL (ISOVUE-300) INJECTION 61%
INTRAVENOUS | Status: AC
  Filled 2017-07-03: qty 100

## 2017-07-03 MED ORDER — ALBUTEROL SULFATE (2.5 MG/3ML) 0.083% IN NEBU: 2.5000 mg | INHALATION_SOLUTION | Freq: Four times a day (QID) | RESPIRATORY_TRACT | Status: DC | PRN

## 2017-07-03 MED ORDER — CITALOPRAM HYDROBROMIDE 20 MG PO TABS
10.0000 mg | ORAL_TABLET | Freq: Two times a day (BID) | ORAL | Status: DC
  Administered 2017-07-03 – 2017-07-06 (×6): 10 mg via ORAL
  Filled 2017-07-03 (×6): qty 1

## 2017-07-03 MED ORDER — ACETAMINOPHEN 325 MG PO TABS
650.0000 mg | ORAL_TABLET | Freq: Four times a day (QID) | ORAL | Status: DC | PRN
  Administered 2017-07-04 – 2017-07-06 (×5): 650 mg via ORAL
  Filled 2017-07-03 (×5): qty 2

## 2017-07-03 MED ORDER — IPRATROPIUM BROMIDE 0.02 % IN SOLN: 0.5000 mg | Freq: Four times a day (QID) | RESPIRATORY_TRACT | Status: DC | PRN

## 2017-07-03 MED ORDER — ENOXAPARIN SODIUM 40 MG/0.4ML ~~LOC~~ SOLN
40.0000 mg | SUBCUTANEOUS | Status: DC
  Administered 2017-07-03 – 2017-07-05 (×3): 40 mg via SUBCUTANEOUS
  Filled 2017-07-03 (×3): qty 0.4

## 2017-07-03 MED ORDER — MAGNESIUM CITRATE PO SOLN: 1.0000 | Freq: Once | ORAL | Status: DC | PRN

## 2017-07-03 MED ORDER — ZOLPIDEM TARTRATE 5 MG PO TABS: 5.0000 mg | ORAL_TABLET | Freq: Every evening | ORAL | Status: DC | PRN

## 2017-07-03 MED ORDER — ACETAMINOPHEN 650 MG RE SUPP: 650.0000 mg | Freq: Four times a day (QID) | RECTAL | Status: DC | PRN

## 2017-07-03 MED ORDER — SENNOSIDES-DOCUSATE SODIUM 8.6-50 MG PO TABS: 1.0000 | ORAL_TABLET | Freq: Every evening | ORAL | Status: DC | PRN

## 2017-07-03 MED ORDER — POTASSIUM CHLORIDE CRYS ER 10 MEQ PO TBCR
40.0000 meq | EXTENDED_RELEASE_TABLET | Freq: Two times a day (BID) | ORAL | Status: AC
  Administered 2017-07-03 – 2017-07-04 (×2): 40 meq via ORAL
  Filled 2017-07-03 (×2): qty 4

## 2017-07-03 NOTE — H&P (Signed)
History and Physical   [facility] [place] [facility] [place] Admission History and Physical [employer], D.O.    Patient Name: Jeffery Downs MR#: [id number] Date of Birth: 1955/08/30  Date of Admission: 07/03/2017  Referring MD/NP/PA: Dr. Regenia Skeeter Primary Care Physician: Christain Sacramento, MD  Chief Complaint:  Chief Complaint  Patient presents with  . Hypotension  . Fever  . Weakness    HPI: Jeffery Downs is a 62 y.o. male with a known history of COPD, GERD, HLD, anxiety/depression presents to the emergency department for evaluation of hypotension.  Patient was in a usual state of health until last month when he was diagnosed with the flu.  States he received antibiotics, but not Tamiflu and his symptoms never fully resolved.  He visited his PCP today for symptoms of fever, aching joints, generalized pain, weakness, productive cough. He was referred to the ED for hypotension with systolic in the 40J at his PCP's office.  Patient denies dizziness, chest pain, shortness of breath, N/V/C/D, abdominal pain, dysuria/frequency, changes in mental status.    EMS/ED Course: Patient received IV fluids, Zosyn. Medical admission has been requested for further management of sepsis, unclear source (hypotension, tachycardia, tachypnea, leukocytosis).  Review of Systems:  CONSTITUTIONAL: Positive fever/chills, fatigue, weakness, negative weight gain/loss, headache. EYES: No blurry or double vision. ENT: No tinnitus, postnasal drip, redness or soreness of the oropharynx. RESPIRATORY: Positive cough, negative dyspnea, wheeze.  No hemoptysis.  CARDIOVASCULAR: No chest pain, palpitations, syncope, orthopnea. No lower extremity edema.  GASTROINTESTINAL: No nausea, vomiting, abdominal pain, diarrhea, constipation.  No hematemesis, melena or hematochezia. GENITOURINARY: No dysuria, frequency, hematuria. ENDOCRINE: No polyuria or nocturia. No heat or cold intolerance. HEMATOLOGY: No anemia,  bruising, bleeding. INTEGUMENTARY: No rashes, ulcers, lesions. MUSCULOSKELETAL: No arthritis, gout, dyspnea. NEUROLOGIC: No numbness, tingling, ataxia, seizure-type activity, weakness. PSYCHIATRIC: No anxiety, depression, insomnia.   Past Medical History:  Diagnosis Date  . Arthritis   . COPD (chronic obstructive pulmonary disease) ([place])   . Depression   . ED (erectile dysfunction)   . GAD (generalized anxiety disorder)   . GERD (gastroesophageal reflux disease)   . History of parotid gland excision    09-26-2014  s/p  left parodtidectomy for mass (per path report- warthin's tumor, benign lymph node and salivary gland  . Hyperlipidemia   . Right hydrocele   . Wears glasses     Past Surgical History:  Procedure Laterality Date  . HYDROCELE EXCISION Right 09/16/2016   Procedure: RIGHT HYDROCELECTOMY ADULT;  Surgeon: Kathie Rhodes, MD;  Location: [facility];  Service: Urology;  Laterality: Right;  . LAPAROSCOPIC INGUINAL HERNIA REPAIR Bilateral 09-12-2011    at [facility] in [place]  . PAROTIDECTOMY Left 09/26/2014   Procedure: LEFT PAROTIDECTOMY;  Surgeon: Izora Gala, MD;  Location: [place];  Service: ENT;  Laterality: Left;  . PARS PLANA VITRECTOMY W/ ENDOPHOTOCOAGULATION Right 08/23/2009   glaucoma, iridodialysis, previous trauma, retained lens fragments     reports that he has been smoking cigarettes.  He has a 45.00 pack-year smoking history. He has never used smokeless tobacco. He reports that he does not drink alcohol or use drugs.  No Known Allergies  Family History  Problem Relation Age of Onset  . Cancer Mother        breast  . Heart disease Father     Prior to Admission medications   Medication Sig Start Date End Date Taking? Authorizing Provider  albuterol (PROVENTIL HFA;VENTOLIN HFA) 108 (90 BASE) MCG/ACT  inhaler Inhale into the lungs every 6 (six) hours as needed for wheezing or shortness of breath.   Yes [provider]   ALPRAZolam Duanne Moron) 1 MG tablet Take 1 mg by mouth at bedtime as needed.   Yes [provider]  citalopram (CELEXA) 20 MG tablet Take 10 mg by mouth 2 (two) times daily.    Yes [provider]  indomethacin (INDOCIN) 50 MG capsule Take 50 mg by mouth 2 (two) times daily. 04/21/17  Yes [provider]  pantoprazole (PROTONIX) 40 MG tablet Take 40 mg by mouth every morning.    Yes [provider]  HYDROcodone-acetaminophen (NORCO/VICODIN) 5-325 MG tablet Take 1-2 tablets by mouth every 6 (six) hours as needed for moderate pain. Patient not taking: Reported on 07/03/2017 09/16/16   Kirke Shaggy, MD    Physical Exam: Vitals:   07/03/17 1730 07/03/17 1809 07/03/17 1830 07/03/17 1900  BP: 112/79 119/74 (!) 100/57 118/86  Pulse: 99 92 91 93  Resp: 20 19 (!) 24 17  Temp:      TempSrc:      SpO2: 96% 98% 97% 99%  Weight:      Height:        GENERAL: 62 y.o.-year-old white male patient, well-developed, well-nourished lying in the bed in no acute distress.  Pleasant and cooperative.   Appears fatigued.  HEENT: Head atraumatic, normocephalic. Pupils equal. Mucus membranes moist. NECK: Supple, full range of motion. No JVD, no bruit heard. No thyroid enlargement, no tenderness, no cervical lymphadenopathy. CHEST: Normal breath sounds bilaterally. No wheezing, rales, rhonchi or crackles. No use of accessory muscles of respiration.  No reproducible chest wall tenderness.  CARDIOVASCULAR: S1, S2 normal. No murmurs, rubs, or gallops. Cap refill <2 seconds. Pulses intact distally.  ABDOMEN: Soft, nondistended, nontender. No rebound, guarding, rigidity. Normoactive bowel sounds present in all four quadrants.  EXTREMITIES: No pedal edema, cyanosis, or clubbing. No calf tenderness or Homan's sign.  NEUROLOGIC: The patient is alert and oriented x 3. Cranial nerves II through XII are grossly intact with no focal sensorimotor deficit. PSYCHIATRIC:  Normal affect, mood,  thought content. SKIN: Warm, dry, and intact without obvious rash, lesion, or ulcer.    Labs on Admission:  CBC: Recent Labs  Lab 07/03/17 1701 07/03/17 1710  WBC 15.5*  --   NEUTROABS 12.4*  --   HGB 16.1 16.7  HCT 46.8 49.0  MCV 97.7  --   PLT 267  --    Basic Metabolic Panel: Recent Labs  Lab 07/03/17 1701 07/03/17 1710  NA 133* 134*  K 3.3* 3.3*  CL 101 99*  CO2 20*  --   GLUCOSE 146* 144*  BUN 14 13  CREATININE 1.67* 1.70*  CALCIUM 8.7*  --    GFR: Estimated Creatinine Clearance: 52.2 mL/min (A) (by C-G formula based on SCr of 1.7 mg/dL (H)). Liver Function Tests: Recent Labs  Lab 07/03/17 1701  AST 19  ALT 10*  ALKPHOS 65  BILITOT 0.7  PROT 7.8  ALBUMIN 3.8   No results for input(s): LIPASE, AMYLASE in the last 168 hours. No results for input(s): AMMONIA in the last 168 hours. Coagulation Profile: No results for input(s): INR, PROTIME in the last 168 hours. Cardiac Enzymes: Recent Labs  Lab 07/03/17 1701  CKTOTAL 89   BNP (last 3 results) No results for input(s): PROBNP in the last 8760 hours. HbA1C: No results for input(s): HGBA1C in the last 72 hours. CBG: No results for input(s): GLUCAP  in the last 168 hours. Lipid Profile: No results for input(s): CHOL, HDL, LDLCALC, TRIG, CHOLHDL, LDLDIRECT in the last 72 hours. Thyroid Function Tests: No results for input(s): TSH, T4TOTAL, FREET4, T3FREE, THYROIDAB in the last 72 hours. Anemia Panel: No results for input(s): VITAMINB12, FOLATE, FERRITIN, TIBC, IRON, RETICCTPCT in the last 72 hours. Urine analysis:    Component Value Date/Time   COLORURINE YELLOW 07/03/2017 1812   APPEARANCEUR CLEAR 07/03/2017 1812   LABSPEC 1.023 07/03/2017 1812   PHURINE 6.0 07/03/2017 1812   GLUCOSEU NEGATIVE 07/03/2017 1812   HGBUR NEGATIVE 07/03/2017 1812   BILIRUBINUR NEGATIVE 07/03/2017 1812   KETONESUR NEGATIVE 07/03/2017 1812   PROTEINUR NEGATIVE 07/03/2017 1812   NITRITE NEGATIVE 07/03/2017 1812    LEUKOCYTESUR NEGATIVE 07/03/2017 1812   Sepsis Labs: @LABRCNTIP (procalcitonin:4,lacticidven:4) )No results found for this or any previous visit (from the past 240 hour(s)).   Radiological Exams on Admission: Dg Chest 2 View  Result Date: 07/03/2017 CLINICAL DATA:  Continued cough and malaise after having the flu last week. Fever and hypotension. Smoker. EXAM: CHEST - 2 VIEW COMPARISON:  08/25/2014. FINDINGS: Normal sized heart. Mildly tortuous aorta. Stable biapical pleural and parenchymal scarring. Otherwise, clear lungs. Mild thoracic spine degenerative changes. IMPRESSION: No acute abnormality. Electronically Signed   By: Claudie Revering M.D.   On: 07/03/2017 18:03   Ct Abdomen Pelvis W Contrast  Result Date: 07/03/2017 CLINICAL DATA:  Acute, generalized abdominal pain and fever. Continued malaise following the flu last week. Hypotension. EXAM: CT ABDOMEN AND PELVIS WITH CONTRAST TECHNIQUE: Multidetector CT imaging of the abdomen and pelvis was performed using the standard protocol following bolus administration of intravenous contrast. CONTRAST:  58mL ISOVUE-300 IOPAMIDOL (ISOVUE-300) INJECTION 61% COMPARISON:  None. FINDINGS: Lower chest: Prominent epicardial fat. Normal sized heart. Minimal bilateral dependent atelectasis. Hepatobiliary: Diffuse low density of the liver relative to the spleen. Normal appearing gallbladder. Pancreas: Unremarkable. No pancreatic ductal dilatation or surrounding inflammatory changes. Spleen: Normal in size without focal abnormality. Adrenals/Urinary Tract: Poorly distended urinary bladder with mild diffuse wall thickening. Normal appearing kidneys, ureters and adrenal glands. Stomach/Bowel: Stomach is within normal limits. Appendix appears normal. No evidence of bowel wall thickening, distention, or inflammatory changes. Vascular/Lymphatic: Atheromatous arterial calcifications without aneurysm. No enlarged lymph nodes. Reproductive: Minimally enlarged prostate gland.  Other: No abdominal wall hernia or abnormality. No abdominopelvic ascites. Musculoskeletal: Mild lumbar and lower thoracic spine degenerative changes. IMPRESSION: 1. Mild diffuse bladder wall thickening. This could be due to cystitis or mild chronic bladder outlet obstruction by the minimally enlarged prostate gland. 2. Mild diffuse hepatic steatosis. Electronically Signed   By: Claudie Revering M.D.   On: 07/03/2017 18:09    EKG: Sinus tachycardia at 104 bpm with normal axis and nonspecific ST-T wave changes.   Assessment/Plan  This is a 62 y.o. male with a history of COPD, GERD, HLD, anxiety/depression now being admitted with:  #. Sepsis secondary to unclear source - Admit to inpatient with telemetry monitoring - IV antibiotics: Zosyn - IV fluid hydration - Follow up blood,urine & sputum cultures - Send respiratory viral panel - Repeat CBC in am.   #. Renal insufficiency, unclear acute or chronic - IV fluids and repeat BMP in AM.  - Avoid nephrotoxic medications: hold indocin - Bladder scan and place foley catheter if evidence of urinary retention - Check renal US   #. Hypokalemia, mild - Replace PO  #. History of anxiety/depression - Continue Xanax, Celexa   #. History of GERD - Continue Protonix  #.  History of OA - Hold indocin 2/2 renal insufficiency  #. History of COPD - Continue Duonebs as needed  Admission status: Inpatient  IV Fluids: NS Diet/Nutrition: Heart healthy Consults called: None  DVT Px: Lovenox, SCDs and early ambulation. Code Status: Full Code  Disposition Plan: To home in 1-2 days  All the records are reviewed and case discussed with ED provider. Management plans discussed with the patient and/or family who express understanding and agree with plan of care.  Marshia Tropea D.O. on 07/03/2017 at 7:30 PM CC: Primary care physician; Christain Sacramento, MD   07/03/2017, 7:30 PM

## 2017-07-03 NOTE — ED Notes (Signed)
Lab reports adding on CK to previous blood draw.

## 2017-07-03 NOTE — ED Notes (Signed)
Patient transported to X-ray 

## 2017-07-03 NOTE — ED Triage Notes (Signed)
Patient reports that he had the flu lst week and continued to "feel bad." patient went to his PCP today. T. 103.2 and BP 78/62. Patient was given Tylenol 650 mg po prior to coming to the ED by the PCP staff.

## 2017-07-03 NOTE — ED Provider Notes (Signed)
[facility] DEPT Provider Note   CSN: [id number] Arrival date & time: 07/03/17  1630     History   Chief Complaint Chief Complaint  Patient presents with  . Hypotension  . Fever  . Weakness    HPI Jeffery Downs is a 62 y.o. male.  HPI  62 year old male presents with fever and hypotension.  Patient has been having similar episodes to this over the last 3 weeks.  3 weeks ago he was diagnosed with an influenza-like illness and put on azithromycin and prednisone.  He does not think his influenza test was positive.  About a week later he had a similar course.  Now since last night has been having chills, fever which was up to 103 at the doctor's office today, cough, headache, myalgias.  He feels a little short of breath.  The cough is nonproductive.  His mouth feels dry but he denies sore throat or chest pain.  He is been having abdominal pain which she says is been ongoing for a couple weeks consistently in the left lower abdomen.  He denies diarrhea.  He states he thinks the abdominal pain is from coughing.  He has been feeling dizzy. BP 70 systolic at doctor's office. HA essentially gone after tylenol from PCP.  Past Medical History:  Diagnosis Date  . Arthritis   . COPD (chronic obstructive pulmonary disease) ([place])   . Depression   . ED (erectile dysfunction)   . GAD (generalized anxiety disorder)   . GERD (gastroesophageal reflux disease)   . History of parotid gland excision    09-26-2014  s/p  left parodtidectomy for mass (per path report- warthin's tumor, benign lymph node and salivary gland  . Hyperlipidemia   . Right hydrocele   . Wears glasses     Patient Active Problem List   Diagnosis Date Noted  . Parotid mass 09/26/2014  . Bilateral inguinal hernia 08/25/2011    Past Surgical History:  Procedure Laterality Date  . HYDROCELE EXCISION Right 09/16/2016   Procedure: RIGHT HYDROCELECTOMY ADULT;  Surgeon: Kathie Rhodes, MD;  Location:  [facility];  Service: Urology;  Laterality: Right;  . LAPAROSCOPIC INGUINAL HERNIA REPAIR Bilateral 09-12-2011    at [facility] in [place]  . PAROTIDECTOMY Left 09/26/2014   Procedure: LEFT PAROTIDECTOMY;  Surgeon: Izora Gala, MD;  Location: [facility];  Service: ENT;  Laterality: Left;  . PARS PLANA VITRECTOMY W/ ENDOPHOTOCOAGULATION Right 08/23/2009   glaucoma, iridodialysis, previous trauma, retained lens fragments        Home Medications    Prior to Admission medications   Medication Sig Start Date End Date Taking? Authorizing Provider  albuterol (PROVENTIL HFA;VENTOLIN HFA) 108 (90 BASE) MCG/ACT inhaler Inhale into the lungs every 6 (six) hours as needed for wheezing or shortness of breath.    [provider]  ALPRAZolam Duanne Moron) 1 MG tablet Take 1 mg by mouth at bedtime as needed.    [provider]  citalopram (CELEXA) 20 MG tablet Take 10 mg by mouth 2 (two) times daily.     [provider]  HYDROcodone-acetaminophen (NORCO/VICODIN) 5-325 MG tablet Take 1-2 tablets by mouth every 6 (six) hours as needed for moderate pain. 09/16/16   Filippou, Braxton Feathers, MD  pantoprazole (PROTONIX) 40 MG tablet Take 40 mg by mouth every morning.     [provider]    Family History Family History  Problem Relation Age of Onset  . Cancer Mother  breast  . Heart disease Father     Social History Social History   Tobacco Use  . Smoking status: Current Every Day Smoker    Packs/day: 1.00    Years: 45.00    Pack years: 45.00    Types: Cigarettes  . Smokeless tobacco: Never Used  Substance Use Topics  . Alcohol use: No  . Drug use: No     Allergies   Patient has no known allergies.   Review of Systems Review of Systems  Constitutional: Positive for chills and fever.  HENT: Positive for sore throat.   Respiratory: Positive for cough and shortness of breath.   Cardiovascular: Negative for chest pain.  Gastrointestinal:  Positive for abdominal pain. Negative for diarrhea and vomiting.  Genitourinary: Negative for dysuria.  Musculoskeletal: Positive for myalgias. Negative for back pain, neck pain and neck stiffness.  Neurological: Positive for headaches.  All other systems reviewed and are negative.    Physical Exam Updated Vital Signs BP 110/79 (BP Location: Right Arm)   Pulse (!) 104   Temp 100.1 F (37.8 C) (Oral)   Resp 12   Ht 6\' 1"  (1.854 m)   Wt 88.5 kg (195 lb)   SpO2 95%   BMI 25.73 kg/m   Physical Exam  Constitutional: He is oriented to person, place, and time. He appears well-developed and well-nourished.  HENT:  Head: Normocephalic and atraumatic.  Right Ear: External ear normal.  Left Ear: External ear normal.  Nose: Nose normal.  Mouth/Throat: Mucous membranes are dry. No oropharyngeal exudate.  Eyes: Right eye exhibits no discharge. Left eye exhibits no discharge.  Neck: Normal range of motion. Neck supple. No neck rigidity. Normal range of motion present.  Cardiovascular: Regular rhythm and normal heart sounds. Tachycardia present.  HR low 100s  Pulmonary/Chest: Effort normal and breath sounds normal. He has no wheezes. He has no rales.  Abdominal: Soft. There is tenderness (mild) in the left lower quadrant.  Musculoskeletal: He exhibits no edema.  Neurological: He is alert and oriented to person, place, and time.  Skin: Skin is warm and dry.  Nursing note and vitals reviewed.    ED Treatments / Results  Labs (all labs ordered are listed, but only abnormal results are displayed) Labs Reviewed  COMPREHENSIVE METABOLIC PANEL - Abnormal; Notable for the following components:      Result Value   Sodium 133 (*)    Potassium 3.3 (*)    CO2 20 (*)    Glucose, Bld 146 (*)    Creatinine, Ser 1.67 (*)    Calcium 8.7 (*)    ALT 10 (*)    GFR calc non Af Amer 43 (*)    GFR calc Af Amer 50 (*)    All other components within normal limits  CBC WITH DIFFERENTIAL/PLATELET -  Abnormal; Notable for the following components:   WBC 15.5 (*)    Neutro Abs 12.4 (*)    Monocytes Absolute 1.2 (*)    All other components within normal limits  I-STAT CHEM 8, ED - Abnormal; Notable for the following components:   Sodium 134 (*)    Potassium 3.3 (*)    Chloride 99 (*)    Creatinine, Ser 1.70 (*)    Glucose, Bld 144 (*)    Calcium, Ion 1.02 (*)    TCO2 21 (*)    All other components within normal limits  CULTURE, BLOOD (ROUTINE X 2)  CULTURE, BLOOD (ROUTINE X 2)  URINALYSIS, ROUTINE W REFLEX  MICROSCOPIC  INFLUENZA PANEL BY PCR (TYPE A & B)  CK  HIV ANTIBODY (ROUTINE TESTING)  CBC  COMPREHENSIVE METABOLIC PANEL  I-STAT CG4 LACTIC ACID, ED    EKG EKG Interpretation  Date/Time:  Monday July 03 2017 16:49:06 EDT Ventricular Rate:  104 PR Interval:    QRS Duration: 88 QT Interval:  344 QTC Calculation: 453 R Axis:   -62 Text Interpretation:  Sinus tachycardia Left anterior fascicular block Borderline T abnormalities, anterior leads Confirmed by Sherwood Gambler [phone]) on 07/03/2017 5:10:18 PM   Radiology Dg Chest 2 View  Result Date: 07/03/2017 CLINICAL DATA:  Continued cough and malaise after having the flu last week. Fever and hypotension. Smoker. EXAM: CHEST - 2 VIEW COMPARISON:  08/25/2014. FINDINGS: Normal sized heart. Mildly tortuous aorta. Stable biapical pleural and parenchymal scarring. Otherwise, clear lungs. Mild thoracic spine degenerative changes. IMPRESSION: No acute abnormality. Electronically Signed   By: Claudie Revering M.D.   On: 07/03/2017 18:03   Ct Abdomen Pelvis W Contrast  Result Date: 07/03/2017 CLINICAL DATA:  Acute, generalized abdominal pain and fever. Continued malaise following the flu last week. Hypotension. EXAM: CT ABDOMEN AND PELVIS WITH CONTRAST TECHNIQUE: Multidetector CT imaging of the abdomen and pelvis was performed using the standard protocol following bolus administration of intravenous contrast. CONTRAST:  41mL ISOVUE-300  IOPAMIDOL (ISOVUE-300) INJECTION 61% COMPARISON:  None. FINDINGS: Lower chest: Prominent epicardial fat. Normal sized heart. Minimal bilateral dependent atelectasis. Hepatobiliary: Diffuse low density of the liver relative to the spleen. Normal appearing gallbladder. Pancreas: Unremarkable. No pancreatic ductal dilatation or surrounding inflammatory changes. Spleen: Normal in size without focal abnormality. Adrenals/Urinary Tract: Poorly distended urinary bladder with mild diffuse wall thickening. Normal appearing kidneys, ureters and adrenal glands. Stomach/Bowel: Stomach is within normal limits. Appendix appears normal. No evidence of bowel wall thickening, distention, or inflammatory changes. Vascular/Lymphatic: Atheromatous arterial calcifications without aneurysm. No enlarged lymph nodes. Reproductive: Minimally enlarged prostate gland. Other: No abdominal wall hernia or abnormality. No abdominopelvic ascites. Musculoskeletal: Mild lumbar and lower thoracic spine degenerative changes. IMPRESSION: 1. Mild diffuse bladder wall thickening. This could be due to cystitis or mild chronic bladder outlet obstruction by the minimally enlarged prostate gland. 2. Mild diffuse hepatic steatosis. Electronically Signed   By: Claudie Revering M.D.   On: 07/03/2017 18:09    Procedures Procedures (including critical care time)  Medications Ordered in ED Medications  sodium chloride 0.9 % bolus 1,000 mL (1,000 mLs Intravenous New Bag/Given 07/03/17 1713)     Initial Impression / Assessment and Plan / ED Course  I have reviewed the triage vital signs and the nursing notes.  Pertinent labs & imaging results that were available during my care of the patient were reviewed by me and considered in my medical decision making (see chart for details).  Clinical Course as of Jul 03 1717  Mon Jul 03, 2017  1718 On my exam, patient is not hypotensive.  He was given Tylenol and so his fever has decreased.  Given normal blood  pressure and normal lactate, this is probably not septic shock.  Will give IV fluids and evaluate for source.  Given the cough and other viral symptoms it is probably more respiratory.  However he does have focal left lower quadrant tenderness so a dose of Zosyn will be given as well.   [SG]    Clinical Course User Index [SG] Sherwood Gambler, MD    Patient meets criteria for sepsis with tachycardia, fever, elevated WBC and acute kidney injury.  However he does not have an obvious bacterial source of infection.  This could be viral given his multiple other symptoms.  Influenza swab will be obtained.  However given his focal left lower quadrant abdominal tenderness, he was given Zosyn and abdominal CT obtained.  This is negative.  Urinalysis does not show obvious source either.  Given the acute kidney injury he will need to be admitted for further fluids and investigation.  However he has not altered and currently does not have headache or neck pain/stiffness and my suspicion for an a CNS infection is low.  Final Clinical Impressions(s) / ED Diagnoses   Final diagnoses:  Fever in adult  Acute kidney injury [facility])    ED Discharge Orders    None       Sherwood Gambler, MD 07/04/17 [phone]

## 2017-07-03 NOTE — Progress Notes (Signed)
PHARMACY NOTE -  Enoch has been consulted to assist with dosing of Zosyn for intra-abdominal infection.  Zosyn 3.375gm IV x 1 was initially ordered and given in the ED.  Scr = 1.7 with CrCl = 52 ml/min.  PLAN: Zosyn 3.375gm IV q8h (each dose infused over 4 hrs) Need for further dosage adjustment appears unlikely at present.    Will sign off at this time.  Please reconsult if a change in clinical status warrants re-evaluation of dosage.  Leone Haven, PharmD 07/03/17 @ 17:32

## 2017-07-04 ENCOUNTER — Inpatient Hospital Stay (HOSPITAL_COMMUNITY): Payer: 59

## 2017-07-04 LAB — COMPREHENSIVE METABOLIC PANEL
ALT: 9 U/L — ABNORMAL LOW (ref 17–63)
AST: 19 U/L (ref 15–41)
Albumin: 3.2 g/dL — ABNORMAL LOW (ref 3.5–5.0)
Alkaline Phosphatase: 52 U/L (ref 38–126)
Anion gap: 11 (ref 5–15)
BUN: 11 mg/dL (ref 6–20)
CHLORIDE: 104 mmol/L (ref 101–111)
CO2: 17 mmol/L — AB (ref 22–32)
Calcium: 8 mg/dL — ABNORMAL LOW (ref 8.9–10.3)
Creatinine, Ser: 1.52 mg/dL — ABNORMAL HIGH (ref 0.61–1.24)
GFR, EST AFRICAN AMERICAN: 56 mL/min — AB (ref >60)
GFR, EST NON AFRICAN AMERICAN: 48 mL/min — AB (ref >60)
Glucose, Bld: 117 mg/dL — ABNORMAL HIGH (ref 65–99)
POTASSIUM: 3.6 mmol/L (ref 3.5–5.1)
SODIUM: 132 mmol/L — AB (ref 135–145)
Total Bilirubin: 0.5 mg/dL (ref 0.3–1.2)
Total Protein: 6.6 g/dL (ref 6.5–8.1)

## 2017-07-04 LAB — CBC
HCT: 41.4 % (ref 39.0–52.0)
HEMOGLOBIN: 14.1 g/dL (ref 13.0–17.0)
MCH: 33.3 pg (ref 26.0–34.0)
MCHC: 34.1 g/dL (ref 30.0–36.0)
MCV: 97.9 fL (ref 78.0–100.0)
Platelets: 221 10*3/uL (ref 150–400)
RBC: 4.23 MIL/uL (ref 4.22–5.81)
RDW: 14.3 % (ref 11.5–15.5)
WBC: 12.5 10*3/uL — ABNORMAL HIGH (ref 4.0–10.5)

## 2017-07-04 LAB — PROCALCITONIN: Procalcitonin: 0.48 ng/mL

## 2017-07-04 LAB — HIV ANTIBODY (ROUTINE TESTING W REFLEX): HIV Screen 4th Generation wRfx: NONREACTIVE

## 2017-07-04 MED ORDER — SODIUM CHLORIDE 0.9 % IV SOLN
1.0000 g | Freq: Three times a day (TID) | INTRAVENOUS | Status: DC
  Administered 2017-07-04 – 2017-07-05 (×3): 1 g via INTRAVENOUS
  Filled 2017-07-04 (×4): qty 1

## 2017-07-04 NOTE — Progress Notes (Signed)
Nutrition Brief Note  Patient identified on the Malnutrition Screening Tool (MST) Report  Wt Readings from Last 15 Encounters:  07/03/17 195 lb (88.5 kg)  09/16/16 182 lb (82.6 kg)  09/26/14 180 lb 9.6 oz (81.9 kg)  10/19/11 181 lb (82.1 kg)  08/25/11 182 lb (82.6 kg)   Patient with PMH significant for COPD, GERD, and HLD. Presents this admission with hypotension. Denies any recent wt loss or loss of appetite.   Body mass index is 25.73 kg/m. Patient meets criteria for overweight based on current BMI.   Current diet order is heart healthy. Labs and medications reviewed.   No nutrition interventions warranted at this time. If nutrition issues arise, please consult RD.   Mariana Single RD, LDN Clinical Nutrition Pager # [phone]

## 2017-07-04 NOTE — Progress Notes (Signed)
PROGRESS NOTE    AUTHOR HATLESTAD  MVH:[id number] DOB: 1955/11/08 DOA: 07/03/2017 PCP: Christain Sacramento, MD   Brief Narrative:  62 year old with past medical history relevant for COPD unknown Gold stage, anxiety/depression, reflux who was sent from PCPs office for evaluation of hypotension and found to have fevers and AK I of unclear etiology.   Assessment & Plan:   Active Problems:   Sepsis ([place])   #) Sepsis of unclear source: Patient continues to have fevers with no localizing signs or symptoms.  His pro-calcitonin is borderline at 0.48.  His urine was bland his chest x-ray shows no evidence of pneumonia.  He does not have any abdominal pain or respiratory symptoms.  He does not have any URI symptoms.  He apparently was treated for influenza approximately 1 month ago. -Continue cefepime (pro-calcitonin borderline), repeat pro-calcitonin -Blood cultures obtained 07/03/2017 no growth to date -HIV pending -We will consider CT chest abdomen pelvis if continues to fever - Follow-up respiratory virus panel, influenza panel negative  #) Acute kidney injury: Patient came in with elevated creatinine with no recent baseline in the system.  Suspect that likely this is now a reflection of his baseline creatinine and chronic kidney disease as his BUN is not elevated. -Hold nephrotoxins -Gentle IV hydration  #) COPD: -Continue PRN albuterol  #) Psych/pain: -Continue alprazolam 1 mg nightly -Continue citalopram 20 mg twice daily  #) GERD: -Continue pantoprazole 40 mg every morning  Fluids: Gentle IV fluids Elect lites: Monitor and supplement Nutrition: Regular diet  Prophylaxis: Enoxaparin  Disposition: Pending evaluation and resolution of fevers  Full code    Consultants:   None  Procedures: (Don't include imaging studies which can be auto populated. Include things that cannot be auto populated i.e. Echo, Carotid and venous dopplers, Foley, Bipap, HD, tubes/drains, wound vac,  central lines etc)  None  Antimicrobials: (specify start and planned stop date. Auto populated tables are space occupying and do not give end dates)  IV cefepime started 07/04/2017   Subjective: Patient reports he is feeling well.  He does not have any chest pain, cough, congestion, diarrhea, abdominal pain, nausea, vomiting, diarrhe.  Objective: Vitals:   07/03/17 2048 07/03/17 2049 07/04/17 0429 07/04/17 0556  BP: 123/88 138/89 134/75   Pulse: (!) 120 (!) 112 (!) 105   Resp:   19   Temp:   (!) 102.9 F (39.4 C) 100.2 F (37.9 C)  TempSrc:   Oral Oral  SpO2: 100% 100% 98%   Weight:      Height:        Intake/Output Summary (Last 24 hours) at 07/04/2017 1323 Last data filed at 07/04/2017 0751 Gross per 24 hour  Intake 2960 ml  Output -  Net 2960 ml   Filed Weights   07/03/17 1649  Weight: 88.5 kg (195 lb)    Examination:  General exam: Appears calm and comfortable  Respiratory system: Clear to auscultation. Respiratory effort normal. Cardiovascular system: Regular rate and rhythm, no murmurs Gastrointestinal system: Soft, nondistended, nontender, plus bowel sounds, no rebound or guarding Central nervous system: Alert and oriented. No focal neurological deficits. Extremities: No lower extremity edema Skin: No rashes on visible skin Psychiatry: Judgement and insight appear normal. Mood & affect appropriate.     Data Reviewed: I have personally reviewed following labs and imaging studies  CBC: Recent Labs  Lab 07/03/17 1701 07/03/17 1710 07/04/17 0521  WBC 15.5*  --  12.5*  NEUTROABS 12.4*  --   --  HGB 16.1 16.7 14.1  HCT 46.8 49.0 41.4  MCV 97.7  --  97.9  PLT 267  --  161   Basic Metabolic Panel: Recent Labs  Lab 07/03/17 1701 07/03/17 1710 07/04/17 0521  NA 133* 134* 132*  K 3.3* 3.3* 3.6  CL 101 99* 104  CO2 20*  --  17*  GLUCOSE 146* 144* 117*  BUN 14 13 11   CREATININE 1.67* 1.70* 1.52*  CALCIUM 8.7*  --  8.0*   GFR: Estimated  Creatinine Clearance: 58.4 mL/min (A) (by C-G formula based on SCr of 1.52 mg/dL (H)). Liver Function Tests: Recent Labs  Lab 07/03/17 1701 07/04/17 0521  AST 19 19  ALT 10* 9*  ALKPHOS 65 52  BILITOT 0.7 0.5  PROT 7.8 6.6  ALBUMIN 3.8 3.2*   No results for input(s): LIPASE, AMYLASE in the last 168 hours. No results for input(s): AMMONIA in the last 168 hours. Coagulation Profile: No results for input(s): INR, PROTIME in the last 168 hours. Cardiac Enzymes: Recent Labs  Lab 07/03/17 1701  CKTOTAL 89   BNP (last 3 results) No results for input(s): PROBNP in the last 8760 hours. HbA1C: No results for input(s): HGBA1C in the last 72 hours. CBG: No results for input(s): GLUCAP in the last 168 hours. Lipid Profile: No results for input(s): CHOL, HDL, LDLCALC, TRIG, CHOLHDL, LDLDIRECT in the last 72 hours. Thyroid Function Tests: No results for input(s): TSH, T4TOTAL, FREET4, T3FREE, THYROIDAB in the last 72 hours. Anemia Panel: No results for input(s): VITAMINB12, FOLATE, FERRITIN, TIBC, IRON, RETICCTPCT in the last 72 hours. Sepsis Labs: Recent Labs  Lab 07/03/17 1709 07/04/17 0521  PROCALCITON  --  0.48  LATICACIDVEN 1.10  --     No results found for this or any previous visit (from the past 240 hour(s)).       Radiology Studies: Dg Chest 2 View  Result Date: 07/03/2017 CLINICAL DATA:  Continued cough and malaise after having the flu last week. Fever and hypotension. Smoker. EXAM: CHEST - 2 VIEW COMPARISON:  08/25/2014. FINDINGS: Normal sized heart. Mildly tortuous aorta. Stable biapical pleural and parenchymal scarring. Otherwise, clear lungs. Mild thoracic spine degenerative changes. IMPRESSION: No acute abnormality. Electronically Signed   By: Claudie Revering M.D.   On: 07/03/2017 18:03   Ct Abdomen Pelvis W Contrast  Result Date: 07/03/2017 CLINICAL DATA:  Acute, generalized abdominal pain and fever. Continued malaise following the flu last week. Hypotension.  EXAM: CT ABDOMEN AND PELVIS WITH CONTRAST TECHNIQUE: Multidetector CT imaging of the abdomen and pelvis was performed using the standard protocol following bolus administration of intravenous contrast. CONTRAST:  11mL ISOVUE-300 IOPAMIDOL (ISOVUE-300) INJECTION 61% COMPARISON:  None. FINDINGS: Lower chest: Prominent epicardial fat. Normal sized heart. Minimal bilateral dependent atelectasis. Hepatobiliary: Diffuse low density of the liver relative to the spleen. Normal appearing gallbladder. Pancreas: Unremarkable. No pancreatic ductal dilatation or surrounding inflammatory changes. Spleen: Normal in size without focal abnormality. Adrenals/Urinary Tract: Poorly distended urinary bladder with mild diffuse wall thickening. Normal appearing kidneys, ureters and adrenal glands. Stomach/Bowel: Stomach is within normal limits. Appendix appears normal. No evidence of bowel wall thickening, distention, or inflammatory changes. Vascular/Lymphatic: Atheromatous arterial calcifications without aneurysm. No enlarged lymph nodes. Reproductive: Minimally enlarged prostate gland. Other: No abdominal wall hernia or abnormality. No abdominopelvic ascites. Musculoskeletal: Mild lumbar and lower thoracic spine degenerative changes. IMPRESSION: 1. Mild diffuse bladder wall thickening. This could be due to cystitis or mild chronic bladder outlet obstruction by the minimally enlarged prostate gland.  2. Mild diffuse hepatic steatosis. Electronically Signed   By: Claudie Revering M.D.   On: 07/03/2017 18:09   US Renal  Result Date: 07/04/2017 CLINICAL DATA:  Renal insufficiency EXAM: RENAL / URINARY TRACT ULTRASOUND COMPLETE COMPARISON:  CT abdomen pelvis of 07/03/2017 FINDINGS: Right Kidney: Length: 11.9 cm. No hydronephrosis is seen. The echogenicity of the renal parenchyma is within normal limits. Left Kidney: Length: 10.7 cm. No hydronephrosis is noted. The echogenicity of the renal parenchyma is within normal limits. Bladder: The  urinary bladder is not well distended. Ureteral jets are visualized. Bladder wall is somewhat thickened as noted on recent CT ostomy due to either cystitis or a degree of bladder outlet obstruction. IMPRESSION: 1. No hydronephrosis.  No renal calculi. 2. The echogenicity of the renal parenchyma is within normal limits. 3. Somewhat thickened urinary bladder wall may be due to cystitis or a degree of bladder outlet obstruction. Electronically Signed   By: Ivar Drape M.D.   On: 07/04/2017 09:32        Scheduled Meds: . citalopram  10 mg Oral BID  . enoxaparin (LOVENOX) injection  40 mg Subcutaneous Q24H  . pantoprazole  40 mg Oral q morning - 10a   Continuous Infusions: . ceFEPime (MAXIPIME) IV       LOS: 1 day    Time spent: [place], MD [facility]  If 7PM-7AM, please contact night-coverage [email or web address] Password [email or web address] 07/04/2017, 1:23 PM

## 2017-07-04 NOTE — Progress Notes (Signed)
PT Cancellation Note  Patient Details Name: Jeffery Downs MRN: [id number] DOB: 04-14-1955   Cancelled Treatment:    Reason Eval/Treat Not Completed: PT screened, no needs identified, will sign off. Spoke to pt who denies need for PT services. Observed pt walking back from bathroom without any difficulty.    Weston Anna, MPT Pager: [phone]

## 2017-07-05 DIAGNOSIS — F17200 Nicotine dependence, unspecified, uncomplicated: Secondary | ICD-10-CM

## 2017-07-05 DIAGNOSIS — N182 Chronic kidney disease, stage 2 (mild): Secondary | ICD-10-CM

## 2017-07-05 DIAGNOSIS — J438 Other emphysema: Secondary | ICD-10-CM

## 2017-07-05 DIAGNOSIS — N179 Acute kidney failure, unspecified: Secondary | ICD-10-CM

## 2017-07-05 LAB — CBC
HCT: 43.1 % (ref 39.0–52.0)
Hemoglobin: 14.7 g/dL (ref 13.0–17.0)
MCH: 33.5 pg (ref 26.0–34.0)
MCHC: 34.1 g/dL (ref 30.0–36.0)
MCV: 98.2 fL (ref 78.0–100.0)
Platelets: 203 10*3/uL (ref 150–400)
RBC: 4.39 MIL/uL (ref 4.22–5.81)
RDW: 14.4 % (ref 11.5–15.5)
WBC: 10.4 10*3/uL (ref 4.0–10.5)

## 2017-07-05 LAB — MAGNESIUM: Magnesium: 2 mg/dL (ref 1.7–2.4)

## 2017-07-05 LAB — BASIC METABOLIC PANEL
Anion gap: 11 (ref 5–15)
Chloride: 101 mmol/L (ref 101–111)
GFR calc non Af Amer: >60 mL/min (ref >60)
Glucose, Bld: 103 mg/dL — ABNORMAL HIGH (ref 65–99)
Potassium: 3.8 mmol/L (ref 3.5–5.1)
Sodium: 134 mmol/L — ABNORMAL LOW (ref 135–145)

## 2017-07-05 LAB — BASIC METABOLIC PANEL WITH GFR
BUN: 9 mg/dL (ref 6–20)
CO2: 22 mmol/L (ref 22–32)
Calcium: 8.6 mg/dL — ABNORMAL LOW (ref 8.9–10.3)
Creatinine, Ser: 1.25 mg/dL — ABNORMAL HIGH (ref 0.61–1.24)
GFR calc Af Amer: >60 mL/min (ref >60)

## 2017-07-05 LAB — PROCALCITONIN: Procalcitonin: 0.62 ng/mL

## 2017-07-05 MED ORDER — SODIUM CHLORIDE 0.9 % IV SOLN
INTRAVENOUS | Status: DC
  Administered 2017-07-05 (×2): via INTRAVENOUS

## 2017-07-05 MED ORDER — IPRATROPIUM BROMIDE 0.02 % IN SOLN: 0.5000 mg | Freq: Four times a day (QID) | RESPIRATORY_TRACT | Status: DC | PRN

## 2017-07-05 MED ORDER — SODIUM CHLORIDE 0.9 % IV SOLN
3.0000 g | Freq: Four times a day (QID) | INTRAVENOUS | Status: DC
  Administered 2017-07-05 – 2017-07-06 (×4): 3 g via INTRAVENOUS
  Filled 2017-07-05 (×5): qty 3

## 2017-07-05 MED ORDER — GUAIFENESIN ER 600 MG PO TB12
600.0000 mg | ORAL_TABLET | Freq: Two times a day (BID) | ORAL | Status: DC
  Administered 2017-07-05 – 2017-07-06 (×2): 600 mg via ORAL
  Filled 2017-07-05 (×2): qty 1

## 2017-07-05 NOTE — Progress Notes (Signed)
PROGRESS NOTE  Jeffery Downs GEX:[id number] DOB: 06/08/1955 DOA: 07/03/2017 PCP: Christain Sacramento, MD  HPI/Recap of past 24 hours:  Last fever 102.5 7pm yesterday evening He has some dry cough, denies chest pain, no hypoxia, no wheezing He also report left lower quadrant pain, no n/v, no diarrhea  Assessment/Plan: Active Problems:   Sepsis ([place])  Sepsis presented on presentation with fever, tachycardia, leukocytosis,  and  Hypotension -Flu swab negative, respiratory viral panel in process, chest x-ray on presentation no acute findings, CT abdomen on presentation with diffuse bladder wall thickening, however UA unremarkable, blood culture negative -On exam he does have some rhonchi and coarse breath sound right lower lobe today, he also have left lower quadrant tenderness -Is treated with Zosyn and cefepime in the hospital, overall he is improving, leukocytosis normalized today, fever subsided, renal function improving -Suspect he has bronchitis plus minus mild diverticulitis, change antibiotic from cefepime to Unasyn, consider discharged on Augmentin, he is advised to follow up with his primary care physician consider colonoscopy, he report has never had colonoscopy in the past. -continue ivf  AKI on CKDII Cr 1.67 on presentation, cr today is 1.25. Renal dosing meds Renal US IMPRESSION: 1. No hydronephrosis.  No renal calculi. 2. The echogenicity of the renal parenchyma is within normal limits. 3. Somewhat thickened urinary bladder wall may be due to cystitis or a degree of bladder outlet obstruction.  Patient denies urinary symptom, ua unremarkable Avoid indocine for now  Mild diffuse hepatic steatosis (incidental finding on CT abdomen) lft wnl, pmd follow up.  COPD  Current everyday smoker No wheezing, no hypoxia Smoking cessation education  Depression/anxiety: Stable on home meds celexa 10mg  bid and xanax 1mg  qhs prn   Code Status: full  Family Communication:  patient   Disposition Plan: home if fever free for 24hrs   Consultants:  none  Procedures:  none  Antibiotics:  Zosyn then cefepime then unasyn   Objective: BP 125/86 (BP Location: Left Arm)   Pulse 83   Temp 98.8 F (37.1 C) (Oral)   Resp 10   Ht 6\' 1"  (1.854 m)   Wt 88.5 kg (195 lb)   SpO2 98%   BMI 25.73 kg/m   Intake/Output Summary (Last 24 hours) at 07/05/2017 1836 Last data filed at 07/05/2017 1500 Gross per 24 hour  Intake 560 ml  Output -  Net 560 ml   Filed Weights   07/03/17 1649  Weight: 88.5 kg (195 lb)    Exam: Patient is examined daily including today on 07/05/2017, exams remain the same as of yesterday except that has changed    General:  NAD  Cardiovascular: RRR  Respiratory: right lower lobe rhonchus, no wheezing  Abdomen: mild left lower quadrant tenderness, no guarding, no rebound, Soft. positive BS  Musculoskeletal: No Edema  Neuro: alert, oriented   Data Reviewed: Basic Metabolic Panel: Recent Labs  Lab 07/03/17 1701 07/03/17 1710 07/04/17 0521 07/05/17 0547  NA 133* 134* 132* 134*  K 3.3* 3.3* 3.6 3.8  CL 101 99* 104 101  CO2 20*  --  17* 22  GLUCOSE 146* 144* 117* 103*  BUN 14 13 11 9   CREATININE 1.67* 1.70* 1.52* 1.25*  CALCIUM 8.7*  --  8.0* 8.6*  MG  --   --   --  2.0   Liver Function Tests: Recent Labs  Lab 07/03/17 1701 07/04/17 0521  AST 19 19  ALT 10* 9*  ALKPHOS 65 52  BILITOT 0.7 0.5  PROT 7.8 6.6  ALBUMIN 3.8 3.2*   No results for input(s): LIPASE, AMYLASE in the last 168 hours. No results for input(s): AMMONIA in the last 168 hours. CBC: Recent Labs  Lab 07/03/17 1701 07/03/17 1710 07/04/17 0521 07/05/17 0547  WBC 15.5*  --  12.5* 10.4  NEUTROABS 12.4*  --   --   --   HGB 16.1 16.7 14.1 14.7  HCT 46.8 49.0 41.4 43.1  MCV 97.7  --  97.9 98.2  PLT 267  --  221 203   Cardiac Enzymes:   Recent Labs  Lab 07/03/17 1701  CKTOTAL 89   BNP (last 3 results) No results for input(s): BNP  in the last 8760 hours.  ProBNP (last 3 results) No results for input(s): PROBNP in the last 8760 hours.  CBG: No results for input(s): GLUCAP in the last 168 hours.  Recent Results (from the past 240 hour(s))  Blood Culture (routine x 2)     Status: None (Preliminary result)   Collection Time: 07/03/17  5:01 PM  Result Value Ref Range Status   Specimen Description   Final    BLOOD BLOOD LEFT FOREARM Performed at [facility], [place] [address], [place], [place] [address]    Special Requests   Final    BOTTLES DRAWN AEROBIC AND ANAEROBIC Blood Culture adequate volume Performed at [facility] [address] [place], [place] [address]    Culture   Final    NO GROWTH 2 DAYS Performed at [facility] [address] [place], [place] [address]    Report Status PENDING  Incomplete  Blood Culture (routine x 2)     Status: None (Preliminary result)   Collection Time: 07/03/17  5:04 PM  Result Value Ref Range Status   Specimen Description   Final    BLOOD RIGHT ANTECUBITAL Performed at [facility] [address], [place], [place] [address]    Special Requests   Final    BOTTLES DRAWN AEROBIC AND ANAEROBIC Blood Culture results may not be optimal due to an inadequate volume of blood received in culture bottles Performed at [facility] [address], [place], [place] [address]    Culture   Final    NO GROWTH 2 DAYS Performed at [facility] [address], [place], [place] [address]    Report Status PENDING  Incomplete  Culture, blood (routine x 2)     Status: None (Preliminary result)   Collection Time: 07/04/17  5:20 AM  Result Value Ref Range Status   Specimen Description   Final    BLOOD LEFT HAND Performed at [facility] [address], [place], [place] [address]    Special Requests   Final    BOTTLES DRAWN AEROBIC AND ANAEROBIC Blood Culture adequate  volume Performed at [facility] [address], [place], [place] [address]    Culture   Final    NO GROWTH 1 DAY Performed at [facility], [address] [place], [place] [address]    Report Status PENDING  Incomplete  Culture, blood (routine x 2)     Status: None (Preliminary result)   Collection Time: 07/04/17  5:28 AM  Result Value Ref Range Status   Specimen Description   Final    BLOOD RIGHT ANTECUBITAL Performed at [facility] [address], [place], [place] [address]    Special Requests  Final    BOTTLES DRAWN AEROBIC AND ANAEROBIC Blood Culture adequate volume Performed at [facility] [address] [place], [place] [address]    Culture   Final    NO GROWTH 1 DAY Performed at [facility], [place] [address] [place], [place] [address]    Report Status PENDING  Incomplete     Studies: No results found.  Scheduled Meds: . citalopram  10 mg Oral BID  . enoxaparin (LOVENOX) injection  40 mg Subcutaneous Q24H  . guaiFENesin  600 mg Oral BID  . pantoprazole  40 mg Oral q morning - 10a    Continuous Infusions: . sodium chloride 75 mL/hr at 07/05/17 1132  . ampicillin-sulbactam (UNASYN) IV 3 g (07/05/17 1737)     Time spent: 62mins I have personally reviewed and interpreted on  07/05/2017 daily labs, tele strips, imagings as discussed above under date review session and assessment and plans.  I reviewed all nursing notes, pharmacy notes,  vitals, pertinent old records  I have discussed plan of care as described above with RN , patient  on 07/05/2017   Florencia Reasons MD, PhD  [facility] Pager [phone]. If 7PM-7AM, please contact night-coverage at [email or web address], password [facility] 07/05/2017, 6:36 PM  LOS: 2 days

## 2017-07-06 DIAGNOSIS — K5732 Diverticulitis of large intestine without perforation or abscess without bleeding: Secondary | ICD-10-CM

## 2017-07-06 DIAGNOSIS — J440 Chronic obstructive pulmonary disease with acute lower respiratory infection: Secondary | ICD-10-CM

## 2017-07-06 DIAGNOSIS — J209 Acute bronchitis, unspecified: Secondary | ICD-10-CM

## 2017-07-06 LAB — BASIC METABOLIC PANEL
ANION GAP: 10 (ref 5–15)
BUN: 6 mg/dL (ref 6–20)
CALCIUM: 8.5 mg/dL — AB (ref 8.9–10.3)
CO2: 25 mmol/L (ref 22–32)
Chloride: 102 mmol/L (ref 101–111)
Creatinine, Ser: 1.09 mg/dL (ref 0.61–1.24)
Glucose, Bld: 107 mg/dL — ABNORMAL HIGH (ref 65–99)
Potassium: 3.6 mmol/L (ref 3.5–5.1)
Sodium: 137 mmol/L (ref 135–145)

## 2017-07-06 LAB — CBC WITH DIFFERENTIAL/PLATELET
BASOS ABS: 0 10*3/uL (ref 0.0–0.1)
BASOS PCT: 1 %
EOS PCT: 2 %
Eosinophils Absolute: 0.1 10*3/uL (ref 0.0–0.7)
HCT: 42.2 % (ref 39.0–52.0)
Hemoglobin: 14.6 g/dL (ref 13.0–17.0)
Lymphocytes Relative: 27 %
Lymphs Abs: 2 10*3/uL (ref 0.7–4.0)
MCH: 33.6 pg (ref 26.0–34.0)
MCHC: 34.6 g/dL (ref 30.0–36.0)
MCV: 97.2 fL (ref 78.0–100.0)
MONO ABS: 0.9 10*3/uL (ref 0.1–1.0)
Monocytes Relative: 12 %
Neutro Abs: 4.4 10*3/uL (ref 1.7–7.7)
Neutrophils Relative %: 60 %
PLATELETS: 219 10*3/uL (ref 150–400)
RBC: 4.34 MIL/uL (ref 4.22–5.81)
RDW: 14.3 % (ref 11.5–15.5)
WBC: 7.4 10*3/uL (ref 4.0–10.5)

## 2017-07-06 LAB — RESPIRATORY PANEL BY PCR
Adenovirus: NOT DETECTED
BORDETELLA PERTUSSIS-RVPCR: NOT DETECTED
CORONAVIRUS 229E-RVPPCR: NOT DETECTED
Chlamydophila pneumoniae: NOT DETECTED
Coronavirus HKU1: NOT DETECTED
Coronavirus NL63: NOT DETECTED
Coronavirus OC43: NOT DETECTED
INFLUENZA B-RVPPCR: NOT DETECTED
Influenza A: NOT DETECTED
METAPNEUMOVIRUS-RVPPCR: NOT DETECTED
Mycoplasma pneumoniae: NOT DETECTED
PARAINFLUENZA VIRUS 2-RVPPCR: NOT DETECTED
PARAINFLUENZA VIRUS 3-RVPPCR: NOT DETECTED
Parainfluenza Virus 1: NOT DETECTED
Parainfluenza Virus 4: NOT DETECTED
RESPIRATORY SYNCYTIAL VIRUS-RVPPCR: NOT DETECTED
RHINOVIRUS / ENTEROVIRUS - RVPPCR: NOT DETECTED

## 2017-07-06 LAB — PROCALCITONIN: Procalcitonin: 0.54 ng/mL

## 2017-07-06 LAB — PSA: Prostatic Specific Antigen: 0.38 ng/mL (ref 0.00–4.00)

## 2017-07-06 LAB — MAGNESIUM: MAGNESIUM: 2.2 mg/dL (ref 1.7–2.4)

## 2017-07-06 LAB — LACTIC ACID, PLASMA: Lactic Acid, Venous: 0.8 mmol/L (ref 0.5–1.9)

## 2017-07-06 MED ORDER — GUAIFENESIN ER 600 MG PO TB12: 600.0000 mg | ORAL_TABLET | Freq: Two times a day (BID) | ORAL | 0 refills | Status: AC

## 2017-07-06 MED ORDER — SENNOSIDES-DOCUSATE SODIUM 8.6-50 MG PO TABS: 1.0000 | ORAL_TABLET | Freq: Every evening | ORAL | 0 refills | Status: AC | PRN

## 2017-07-06 MED ORDER — SACCHAROMYCES BOULARDII 250 MG PO CAPS: 250.0000 mg | ORAL_CAPSULE | Freq: Two times a day (BID) | ORAL | 0 refills | Status: AC

## 2017-07-06 MED ORDER — AMOXICILLIN-POT CLAVULANATE 875-125 MG PO TABS: 1.0000 | ORAL_TABLET | Freq: Two times a day (BID) | ORAL | 0 refills | Status: AC

## 2017-07-06 MED ORDER — FLUTICASONE PROPIONATE 50 MCG/ACT NA SUSP: 1.0000 | Freq: Every day | NASAL | 0 refills | Status: AC

## 2017-07-06 NOTE — Discharge Summary (Addendum)
Discharge Summary  Jeffery Downs MVH:[id number] DOB: 11-15-1955  PCP: Christain Sacramento, MD  Admit date: 07/03/2017 Discharge date: 07/06/2017  Time spent: <8mins  Recommendations for Outpatient Follow-up:  1. F/u with PMD within a week  for hospital discharge follow up, repeat cbc/bmp at follow up. pmd to repeat cxr in 2-3 weeks. pmd to refer patient to have colonoscopy.  Discharge Diagnoses:  Active Hospital Problems   Diagnosis Date Noted  . Sepsis ([place]) 07/03/2017    Resolved Hospital Problems  No resolved problems to display.    Discharge Condition: stable  Diet recommendation: regular diet  Filed Weights   07/03/17 1649  Weight: 88.5 kg (195 lb)    History of present illness:  Patient Name: Jeffery Downs MR#: [id number] Date of Birth: 02-26-1956         Date of Admission: 07/03/2017   Primary Care Physician: Christain Sacramento, MD  Chief Complaint:     Chief Complaint  Patient presents with  . Hypotension  . Fever  . Weakness    HPI: Jeffery Downs is a 62 y.o. male with a known history of COPD, GERD, HLD, anxiety/depression presents to the emergency department for evaluation of hypotension.  Patient was in a usual state of health until last month when he was diagnosed with the flu.  States he received antibiotics, but not Tamiflu and his symptoms never fully resolved.  He visited his PCP today for symptoms of fever, aching joints, generalized pain, weakness, productive cough. He was referred to the ED for hypotension with systolic in the 02V at his PCP's office.  Patient denies dizziness, chest pain, shortness of breath, N/V/C/D, abdominal pain, dysuria/frequency, changes in mental status.    EMS/ED Course: Patient received IV fluids, Zosyn. Medical admission has been requested for further management of sepsis, unclear source (hypotension, tachycardia, tachypnea, leukocytosis).      Hospital Course:  Active Problems:   Sepsis ([place])   Sepsis  presented on presentation with fever, tachycardia, leukocytosis,  and  Hypotension -Flu swab negative, respiratory viral panel negative, chest x-ray on presentation no acute findings, CT abdomen on presentation with diffuse bladder wall thickening, however UA unremarkable, blood culture negative -On exam he does have some rhonchi and coarse breath sound right lower lobe, he also have left lower quadrant tenderness.  -Is treated with Zosyn and cefepime in the hospital, overall he is improving, leukocytosis normalized, fever resolved, renal function improving. Ab pain resolved, cough resolved.  -Suspect he has bronchitis plus minus mild diverticulitis, change antibiotic from cefepime to Unasyn,  discharged on Augmentin, he is advised to follow up with his primary care physician consider colonoscopy, he report has never had colonoscopy in the past. -pmd to repeat cxr in 2-3 weeks  AKI on CKDII Cr 1.67 on presentation, cr at discharge is 1.09. Renal dosing meds  Renal US IMPRESSION: 1. No hydronephrosis. No renal calculi. 2. The echogenicity of the renal parenchyma is within normal limits. 3. Somewhat thickened urinary bladder wall may be due to cystitis or a degree of bladder outlet obstruction.  Patient denies urinary symptom, ua unremarkable. psa 0.38.   Mild diffuse hepatic steatosis (incidental finding on CT abdomen) lft wnl, pmd follow up.  COPD  Current everyday smoker No wheezing, no hypoxia Smoking cessation education provided >32mins.  Depression/anxiety: Stable on home meds celexa 10mg  bid and xanax 1mg  qhs prn   Code Status: full  Family Communication: patient   Disposition Plan: home    Consultants:  none  Procedures:  none  Antibiotics:  Zosyn then cefepime then unasyn   Discharge Exam: BP (!) 126/93 (BP Location: Right Arm)   Pulse 79   Temp 98.4 F (36.9 C) (Oral)   Resp 19   Ht 6\' 1"  (1.854 m)   Wt 88.5 kg (195 lb)   SpO2 95%    BMI 25.73 kg/m    General:  NAD  Cardiovascular: RRR  Respiratory: right lower lobe less rhonchus, no wheezing  Abdomen: mild left lower quadrant tenderness has resolved, no guarding, no rebound, Soft. positive BS  Musculoskeletal: No Edema  Neuro: alert, oriented    Discharge Instructions You were cared for by a hospitalist during your hospital stay. If you have any questions about your discharge medications or the care you received while you were in the hospital after you are discharged, you can call the unit and asked to speak with the hospitalist on call if the hospitalist that took care of you is not available. Once you are discharged, your primary care physician will handle any further medical issues. Please note that NO REFILLS for any discharge medications will be authorized once you are discharged, as it is imperative that you return to your primary care physician (or establish a relationship with a primary care physician if you do not have one) for your aftercare needs so that they can reassess your need for medications and monitor your lab values.  Discharge Instructions    Diet general   Complete by:  As directed    Increase activity slowly   Complete by:  As directed      Allergies as of 07/06/2017   No Known Allergies     Medication List    STOP taking these medications   HYDROcodone-acetaminophen 5-325 MG tablet Commonly known as:  NORCO/VICODIN     TAKE these medications   albuterol 108 (90 Base) MCG/ACT inhaler Commonly known as:  PROVENTIL HFA;VENTOLIN HFA Inhale into the lungs every 6 (six) hours as needed for wheezing or shortness of breath.   ALPRAZolam 1 MG tablet Commonly known as:  XANAX Take 1 mg by mouth at bedtime as needed.   amoxicillin-clavulanate 875-125 MG tablet Commonly known as:  AUGMENTIN Take 1 tablet by mouth 2 (two) times daily for 3 days.   citalopram 20 MG tablet Commonly known as:  CELEXA Take 10 mg by mouth 2 (two) times  daily.   fluticasone 50 MCG/ACT nasal spray Commonly known as:  FLONASE Place 1 spray into both nostrils daily.   guaiFENesin 600 MG 12 hr tablet Commonly known as:  MUCINEX Take 1 tablet (600 mg total) by mouth 2 (two) times daily.   indomethacin 50 MG capsule Commonly known as:  INDOCIN Take 50 mg by mouth 2 (two) times daily.   pantoprazole 40 MG tablet Commonly known as:  PROTONIX Take 40 mg by mouth every morning.   saccharomyces boulardii 250 MG capsule Commonly known as:  FLORASTOR Take 1 capsule (250 mg total) by mouth 2 (two) times daily for 10 days.   senna-docusate 8.6-50 MG tablet Commonly known as:  Senokot-S Take 1 tablet by mouth at bedtime as needed for mild constipation.      No Known Allergies Follow-up Information    Christain Sacramento, MD Follow up in 1 week(s).   Specialty:  Family Medicine Why:  hospital discharge follow up. repeat cbc/bmpat follow up. pmd to repeat cxr in2-3 weeks. pmd to refer to colonoscopy Contact information: [address]  [place] [address] [phone]            The results of significant diagnostics from this hospitalization (including imaging, microbiology, ancillary and laboratory) are listed below for reference.    Significant Diagnostic Studies: Dg Chest 2 View  Result Date: 07/03/2017 CLINICAL DATA:  Continued cough and malaise after having the flu last week. Fever and hypotension. Smoker. EXAM: CHEST - 2 VIEW COMPARISON:  08/25/2014. FINDINGS: Normal sized heart. Mildly tortuous aorta. Stable biapical pleural and parenchymal scarring. Otherwise, clear lungs. Mild thoracic spine degenerative changes. IMPRESSION: No acute abnormality. Electronically Signed   By: Claudie Revering M.D.   On: 07/03/2017 18:03   Ct Abdomen Pelvis W Contrast  Result Date: 07/03/2017 CLINICAL DATA:  Acute, generalized abdominal pain and fever. Continued malaise following the flu last week. Hypotension. EXAM: CT ABDOMEN AND PELVIS WITH  CONTRAST TECHNIQUE: Multidetector CT imaging of the abdomen and pelvis was performed using the standard protocol following bolus administration of intravenous contrast. CONTRAST:  42mL ISOVUE-300 IOPAMIDOL (ISOVUE-300) INJECTION 61% COMPARISON:  None. FINDINGS: Lower chest: Prominent epicardial fat. Normal sized heart. Minimal bilateral dependent atelectasis. Hepatobiliary: Diffuse low density of the liver relative to the spleen. Normal appearing gallbladder. Pancreas: Unremarkable. No pancreatic ductal dilatation or surrounding inflammatory changes. Spleen: Normal in size without focal abnormality. Adrenals/Urinary Tract: Poorly distended urinary bladder with mild diffuse wall thickening. Normal appearing kidneys, ureters and adrenal glands. Stomach/Bowel: Stomach is within normal limits. Appendix appears normal. No evidence of bowel wall thickening, distention, or inflammatory changes. Vascular/Lymphatic: Atheromatous arterial calcifications without aneurysm. No enlarged lymph nodes. Reproductive: Minimally enlarged prostate gland. Other: No abdominal wall hernia or abnormality. No abdominopelvic ascites. Musculoskeletal: Mild lumbar and lower thoracic spine degenerative changes. IMPRESSION: 1. Mild diffuse bladder wall thickening. This could be due to cystitis or mild chronic bladder outlet obstruction by the minimally enlarged prostate gland. 2. Mild diffuse hepatic steatosis. Electronically Signed   By: Claudie Revering M.D.   On: 07/03/2017 18:09   US Renal  Result Date: 07/04/2017 CLINICAL DATA:  Renal insufficiency EXAM: RENAL / URINARY TRACT ULTRASOUND COMPLETE COMPARISON:  CT abdomen pelvis of 07/03/2017 FINDINGS: Right Kidney: Length: 11.9 cm. No hydronephrosis is seen. The echogenicity of the renal parenchyma is within normal limits. Left Kidney: Length: 10.7 cm. No hydronephrosis is noted. The echogenicity of the renal parenchyma is within normal limits. Bladder: The urinary bladder is not well  distended. Ureteral jets are visualized. Bladder wall is somewhat thickened as noted on recent CT ostomy due to either cystitis or a degree of bladder outlet obstruction. IMPRESSION: 1. No hydronephrosis.  No renal calculi. 2. The echogenicity of the renal parenchyma is within normal limits. 3. Somewhat thickened urinary bladder wall may be due to cystitis or a degree of bladder outlet obstruction. Electronically Signed   By: Ivar Drape M.D.   On: 07/04/2017 09:32   Dg Chest Port 1 View  Result Date: 07/04/2017 CLINICAL DATA:  Onset of fever  Hx COPD, GERD EXAM: PORTABLE CHEST - 1 VIEW COMPARISON:  07/03/2017 FINDINGS: Linear scarring or atelectasis in the right suprahilar region. Asymmetric right apical pleuroparenchymal scarring stable. Lungs otherwise clear. Heart size and mediastinal contours are within normal limits. No effusion. Visualized bones unremarkable. IMPRESSION: No acute cardiopulmonary disease. Electronically Signed   By: Lucrezia Europe M.D.   On: 07/04/2017 13:34    Microbiology: Recent Results (from the past 240 hour(s))  Blood Culture (routine x 2)     Status: None (Preliminary result)  Collection Time: 07/03/17  5:01 PM  Result Value Ref Range Status   Specimen Description   Final    BLOOD BLOOD LEFT FOREARM Performed at [facility] [address] [place], [place] [address]    Special Requests   Final    BOTTLES DRAWN AEROBIC AND ANAEROBIC Blood Culture adequate volume Performed at [facility] [address] [place], [place] [address]    Culture   Final    NO GROWTH 2 DAYS Performed at [facility] [address], [place], [place] [address]    Report Status PENDING  Incomplete  Blood Culture (routine x 2)     Status: None (Preliminary result)   Collection Time: 07/03/17  5:04 PM  Result Value Ref Range Status   Specimen Description   Final    BLOOD RIGHT ANTECUBITAL Performed at [facility]  [address] [place], [place] [address]    Special Requests   Final    BOTTLES DRAWN AEROBIC AND ANAEROBIC Blood Culture results may not be optimal due to an inadequate volume of blood received in culture bottles Performed at [facility] [address] [place], [place] [address]    Culture   Final    NO GROWTH 2 DAYS Performed at [facility] [address] [place], [place] [address]    Report Status PENDING  Incomplete  Culture, blood (routine x 2)     Status: None (Preliminary result)   Collection Time: 07/04/17  5:20 AM  Result Value Ref Range Status   Specimen Description   Final    BLOOD LEFT HAND Performed at [facility] [address] [place], [place] [address]    Special Requests   Final    BOTTLES DRAWN AEROBIC AND ANAEROBIC Blood Culture adequate volume Performed at [facility] [address] [place], [place] [address]    Culture   Final    NO GROWTH 1 DAY Performed at [facility], [place] [address], [place], [place] [address]    Report Status PENDING  Incomplete  Culture, blood (routine x 2)     Status: None (Preliminary result)   Collection Time: 07/04/17  5:28 AM  Result Value Ref Range Status   Specimen Description   Final    BLOOD RIGHT ANTECUBITAL Performed at [facility] [address] [place], [place] [address]    Special Requests   Final    BOTTLES DRAWN AEROBIC AND ANAEROBIC Blood Culture adequate volume Performed at [facility] [address] [place], [place] [address]    Culture   Final    NO GROWTH 1 DAY Performed at [facility], [place] [address] [place], [place] [address]    Report Status PENDING  Incomplete  Respiratory Panel by PCR     Status: None   Collection Time: 07/05/17  2:30 PM  Result Value Ref Range Status   Adenovirus NOT DETECTED NOT DETECTED Final   Coronavirus 229E NOT DETECTED NOT DETECTED Final    Coronavirus HKU1 NOT DETECTED NOT DETECTED Final   Coronavirus NL63 NOT DETECTED NOT DETECTED Final   Coronavirus OC43 NOT DETECTED NOT DETECTED Final   Metapneumovirus NOT DETECTED NOT DETECTED Final   Rhinovirus / Enterovirus NOT DETECTED NOT DETECTED Final   Influenza A NOT DETECTED NOT DETECTED Final   Influenza B NOT DETECTED NOT DETECTED Final   Parainfluenza Virus 1 NOT  DETECTED NOT DETECTED Final   Parainfluenza Virus 2 NOT DETECTED NOT DETECTED Final   Parainfluenza Virus 3 NOT DETECTED NOT DETECTED Final   Parainfluenza Virus 4 NOT DETECTED NOT DETECTED Final   Respiratory Syncytial Virus NOT DETECTED NOT DETECTED Final   Bordetella pertussis NOT DETECTED NOT DETECTED Final   Chlamydophila pneumoniae NOT DETECTED NOT DETECTED Final   Mycoplasma pneumoniae NOT DETECTED NOT DETECTED Final    Comment: Performed at [facility], [place] [address] [place], [place] [address]     Labs: Basic Metabolic Panel: Recent Labs  Lab 07/03/17 1701 07/03/17 1710 07/04/17 0521 07/05/17 0547 07/06/17 0553  NA 133* 134* 132* 134* 137  K 3.3* 3.3* 3.6 3.8 3.6  CL 101 99* 104 101 102  CO2 20*  --  17* 22 25  GLUCOSE 146* 144* 117* 103* 107*  BUN 14 13 11 9 6   CREATININE 1.67* 1.70* 1.52* 1.25* 1.09  CALCIUM 8.7*  --  8.0* 8.6* 8.5*  MG  --   --   --  2.0 2.2   Liver Function Tests: Recent Labs  Lab 07/03/17 1701 07/04/17 0521  AST 19 19  ALT 10* 9*  ALKPHOS 65 52  BILITOT 0.7 0.5  PROT 7.8 6.6  ALBUMIN 3.8 3.2*   No results for input(s): LIPASE, AMYLASE in the last 168 hours. No results for input(s): AMMONIA in the last 168 hours. CBC: Recent Labs  Lab 07/03/17 1701 07/03/17 1710 07/04/17 0521 07/05/17 0547 07/06/17 0553  WBC 15.5*  --  12.5* 10.4 7.4  NEUTROABS 12.4*  --   --   --  4.4  HGB 16.1 16.7 14.1 14.7 14.6  HCT 46.8 49.0 41.4 43.1 42.2  MCV 97.7  --  97.9 98.2 97.2  PLT 267  --  221 203 219   Cardiac Enzymes: Recent Labs  Lab 07/03/17 1701    CKTOTAL 89   BNP: BNP (last 3 results) No results for input(s): BNP in the last 8760 hours.  ProBNP (last 3 results) No results for input(s): PROBNP in the last 8760 hours.  CBG: No results for input(s): GLUCAP in the last 168 hours.     Signed:  Florencia Reasons MD, PhD  [facility] 07/06/2017, 10:53 AM

## 2017-07-06 NOTE — Progress Notes (Signed)
Patient has discharged to home on 07/06/17. Discharge instruction including medication and appointment was given to patient. Patient has no question at this time.

## 2017-07-08 LAB — CULTURE, BLOOD (ROUTINE X 2)
Culture: NO GROWTH
Culture: NO GROWTH
SPECIAL REQUESTS: ADEQUATE

## 2017-07-09 LAB — CULTURE, BLOOD (ROUTINE X 2)
CULTURE: NO GROWTH
CULTURE: NO GROWTH
SPECIAL REQUESTS: ADEQUATE
Special Requests: ADEQUATE

## 2019-12-16 IMAGING — CT CT ABD-PELV W/ CM
2 of 5 series · 16 of 46 positions shown, 18 images · IV contrast (ISOVUE)
Comparison: None.

CLINICAL DATA: Acute, generalized abdominal pain and fever.
Continued malaise following the flu last week. Hypotension.

EXAM:
CT ABDOMEN AND PELVIS WITH CONTRAST
TECHNIQUE: Multidetector CT imaging of the abdomen and pelvis was performed
using the standard protocol following bolus administration of
intravenous contrast.
CONTRAST:  80mL SJYSXF-BOO IOPAMIDOL (SJYSXF-BOO) INJECTION 61%

[Series 2: axial st · axial · 0.86mm/px · z∈[+793,+1203]mm · 13 of 96 slices shown, 15 images]
[im 7/96  soft-tissue]
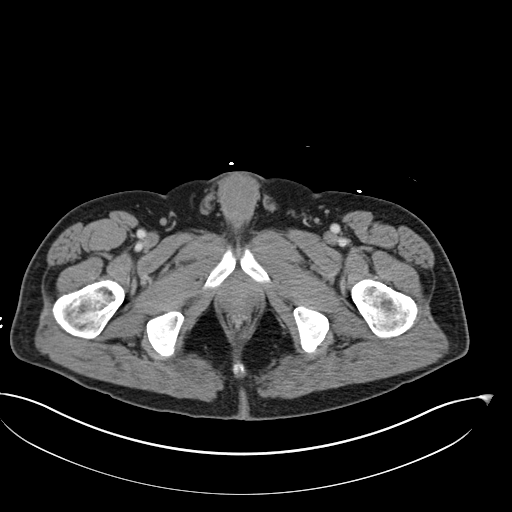
[im 7/96  bone]
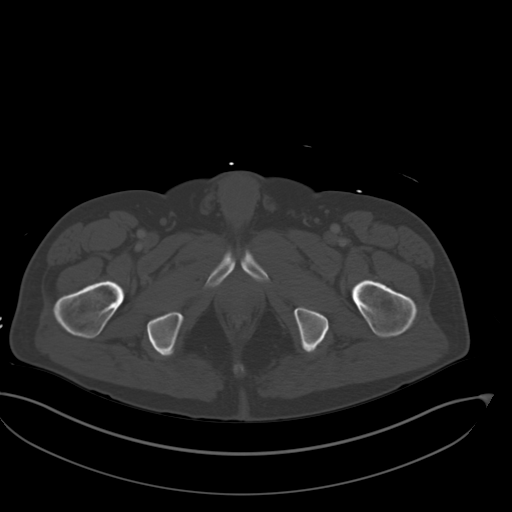
[im 14/96  soft-tissue]
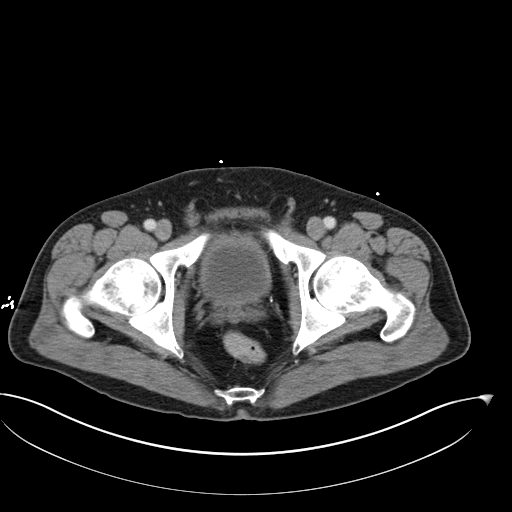
[im 21/96  soft-tissue]
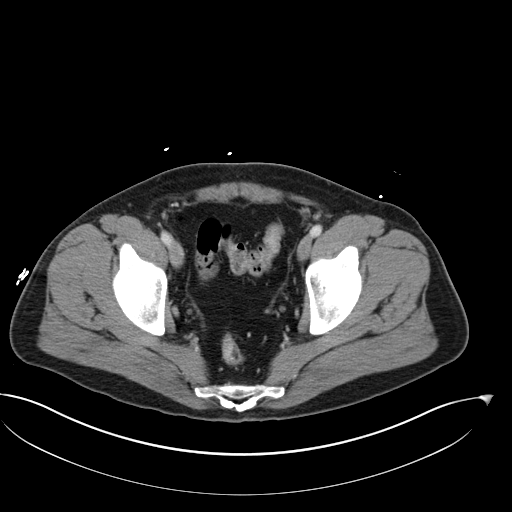
[im 28/96  soft-tissue]
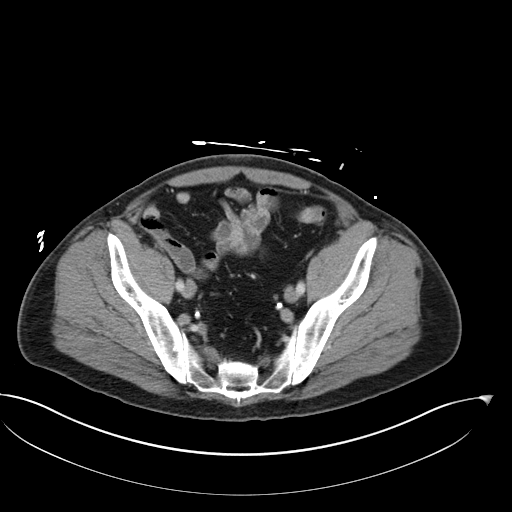
[im 34/96  soft-tissue]
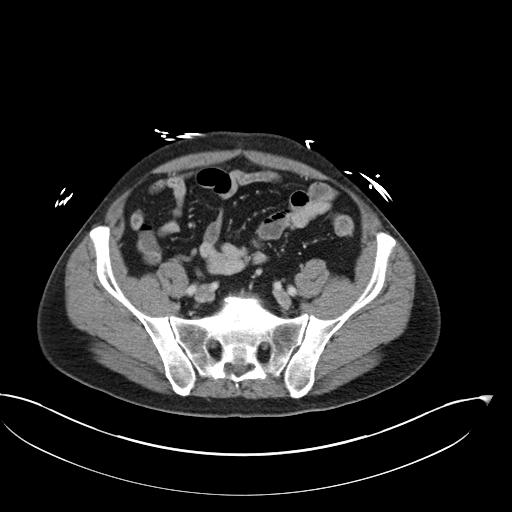
[im 41/96  soft-tissue]
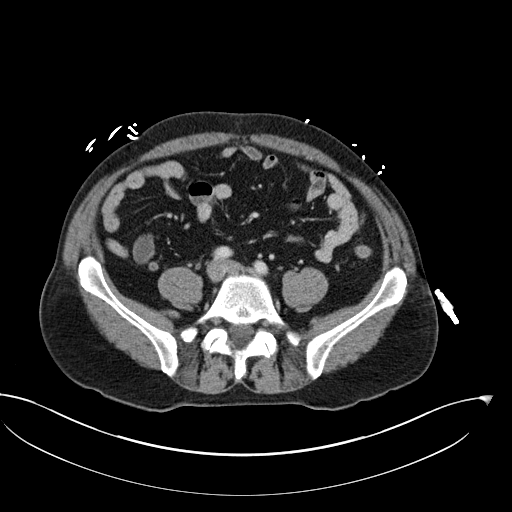
[im 48/96  soft-tissue]
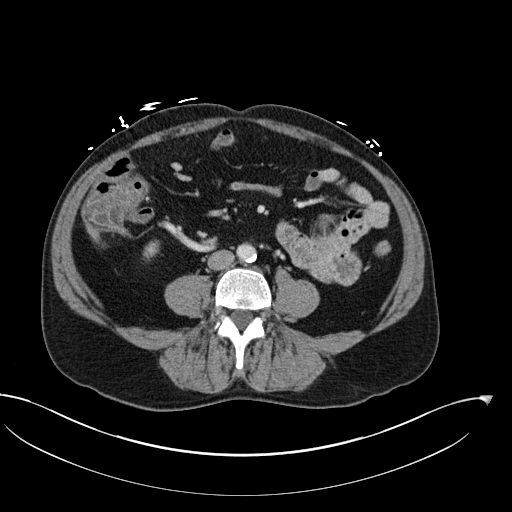
[im 55/96  soft-tissue]
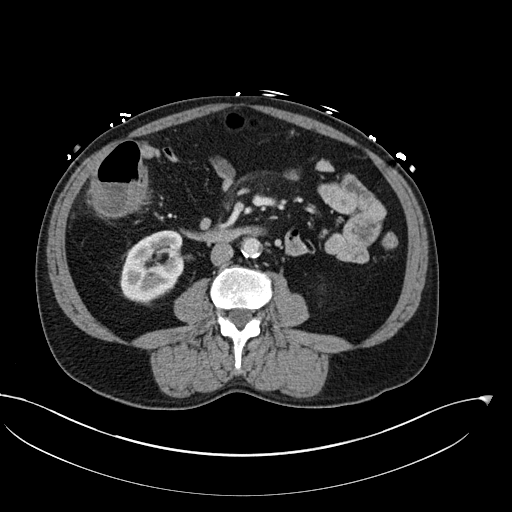
[im 62/96  soft-tissue]
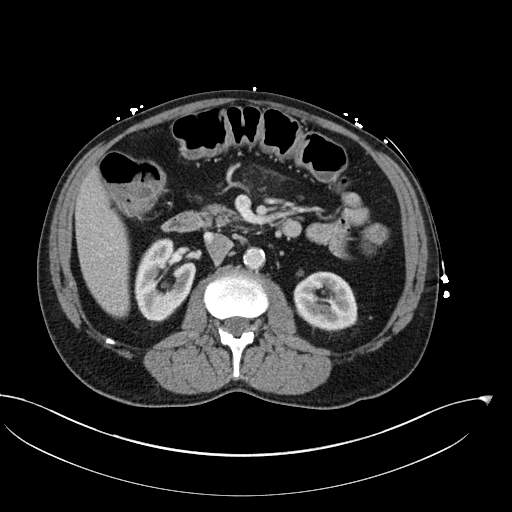
[im 62/96  bone]
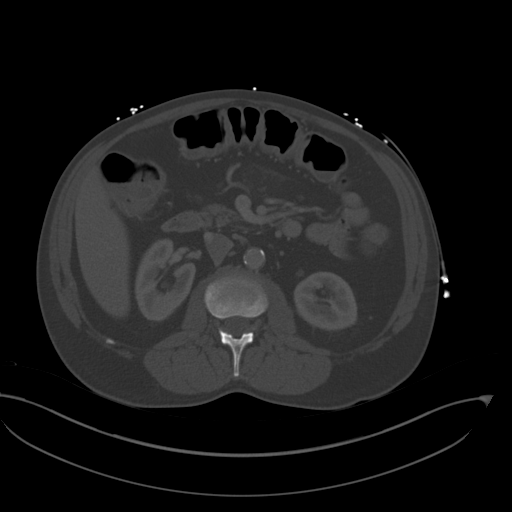
[im 68/96  soft-tissue]
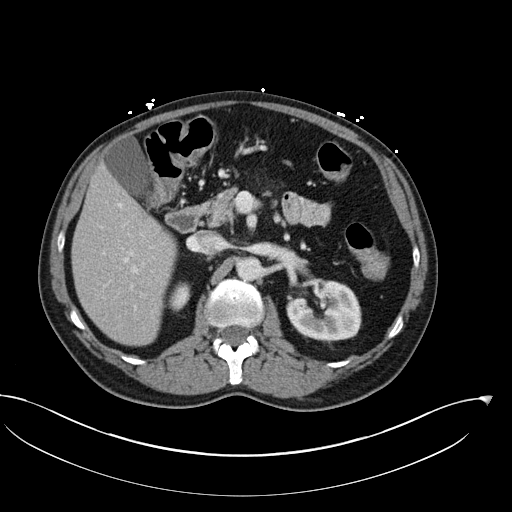
[im 75/96  soft-tissue]
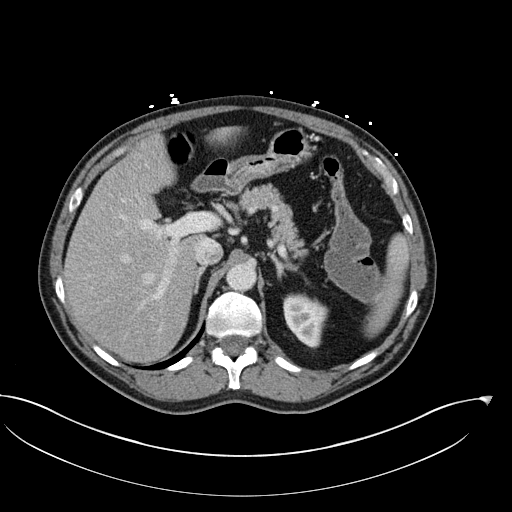
[im 82/96  soft-tissue]
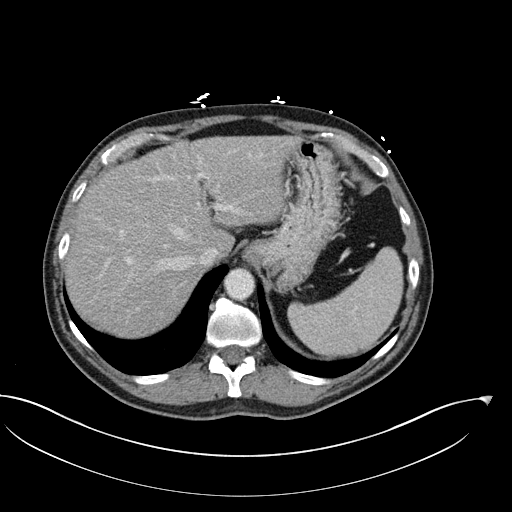
[im 89/96  soft-tissue]
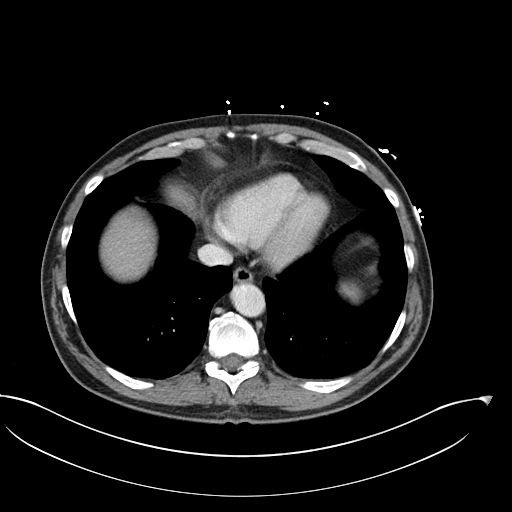

[Series 5: coronal st · coronal · 0.87mm/px · 3 of 94 slices shown]
[im 32/94  soft-tissue]
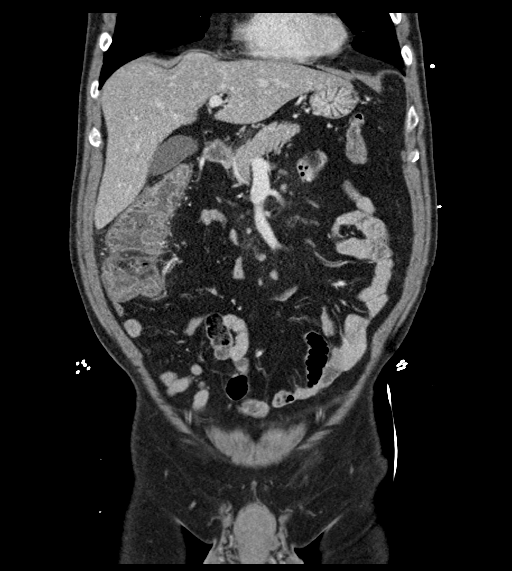
[im 42/94  soft-tissue]
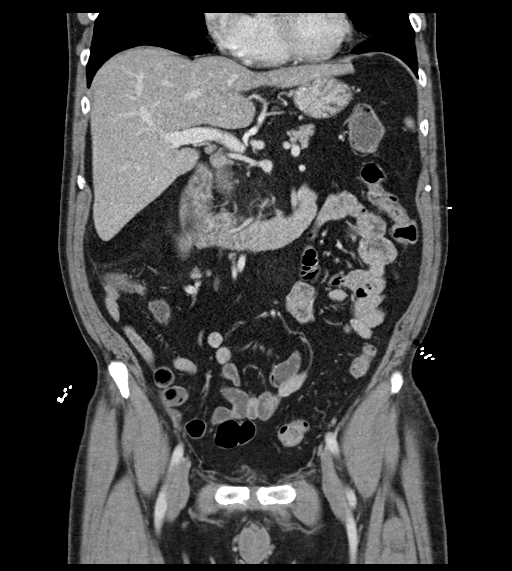
[im 52/94  soft-tissue]
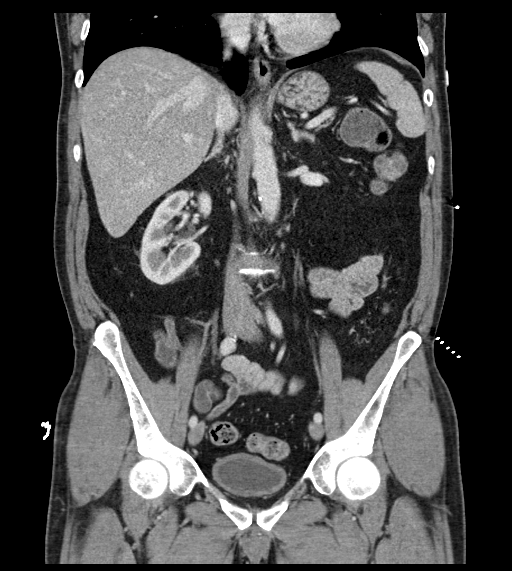

[16 of 46 positions shown; findings below may reference images not displayed]

FINDINGS: Lower chest: Prominent epicardial fat. Normal sized heart. Minimal
bilateral dependent atelectasis.

Hepatobiliary: Diffuse low density of the liver relative to the
spleen. Normal appearing gallbladder.

Pancreas: Unremarkable. No pancreatic ductal dilatation or
surrounding inflammatory changes.

Spleen: Normal in size without focal abnormality.

Adrenals/Urinary Tract: Poorly distended urinary bladder with mild
diffuse wall thickening. Normal appearing kidneys, ureters and
adrenal glands.

Stomach/Bowel: Stomach is within normal limits. Appendix appears
normal. No evidence of bowel wall thickening, distention, or
inflammatory changes.

Vascular/Lymphatic: Atheromatous arterial calcifications without
aneurysm. No enlarged lymph nodes.

Reproductive: Minimally enlarged prostate gland.

Other: No abdominal wall hernia or abnormality. No abdominopelvic
ascites.

Musculoskeletal: Mild lumbar and lower thoracic spine degenerative
changes.
IMPRESSION: 1. Mild diffuse bladder wall thickening. This could be due to
cystitis or mild chronic bladder outlet obstruction by the minimally
enlarged prostate gland.
2. Mild diffuse hepatic steatosis.

## 2020-10-06 ENCOUNTER — Institutional Professional Consult (permissible substitution): Payer: Worker's Compensation | Admitting: Pulmonary Disease

## 2021-12-28 ENCOUNTER — Emergency Department (HOSPITAL_COMMUNITY)
Admission: EM | Admit: 2021-12-28 | Discharge: 2021-12-28 | Disposition: A | Discharge status: Home / Self Care | Payer: No Typology Code available for payment source | Attending: Emergency Medicine | Admitting: Emergency Medicine

## 2021-12-28 ENCOUNTER — Encounter (HOSPITAL_COMMUNITY): Payer: Self-pay

## 2021-12-28 ENCOUNTER — Emergency Department (HOSPITAL_COMMUNITY): Payer: Worker's Compensation

## 2021-12-28 DIAGNOSIS — Y990 Civilian activity done for income or pay: Secondary | ICD-10-CM | POA: Diagnosis not present

## 2021-12-28 DIAGNOSIS — M25512 Pain in left shoulder: Secondary | ICD-10-CM | POA: Diagnosis not present

## 2021-12-28 DIAGNOSIS — W11XXXA Fall on and from ladder, initial encounter: Secondary | ICD-10-CM | POA: Insufficient documentation

## 2021-12-28 DIAGNOSIS — I7121 Aneurysm of the ascending aorta, without rupture: Secondary | ICD-10-CM | POA: Insufficient documentation

## 2021-12-28 DIAGNOSIS — M25511 Pain in right shoulder: Secondary | ICD-10-CM | POA: Diagnosis not present

## 2021-12-28 DIAGNOSIS — M542 Cervicalgia: Secondary | ICD-10-CM | POA: Insufficient documentation

## 2021-12-28 DIAGNOSIS — W19XXXA Unspecified fall, initial encounter: Secondary | ICD-10-CM

## 2021-12-28 DIAGNOSIS — F17200 Nicotine dependence, unspecified, uncomplicated: Secondary | ICD-10-CM | POA: Diagnosis not present

## 2021-12-28 MED ORDER — IBUPROFEN 200 MG PO TABS
600.0000 mg | ORAL_TABLET | Freq: Once | ORAL | Status: AC
  Administered 2021-12-28: 600 mg via ORAL
  Filled 2021-12-28: qty 3

## 2021-12-28 MED ORDER — ACETAMINOPHEN 325 MG PO TABS: 650.0000 mg | ORAL_TABLET | Freq: Four times a day (QID) | ORAL | 0 refills | Status: AC | PRN

## 2021-12-28 MED ORDER — ACETAMINOPHEN 500 MG PO TABS
1000.0000 mg | ORAL_TABLET | Freq: Once | ORAL | Status: AC
  Administered 2021-12-28: 1000 mg via ORAL
  Filled 2021-12-28: qty 2

## 2021-12-28 MED ORDER — IBUPROFEN 600 MG PO TABS: 600.0000 mg | ORAL_TABLET | Freq: Three times a day (TID) | ORAL | 0 refills | Status: AC | PRN

## 2021-12-28 MED ORDER — CYCLOBENZAPRINE HCL 10 MG PO TABS: 10.0000 mg | ORAL_TABLET | Freq: Two times a day (BID) | ORAL | 0 refills | Status: AC | PRN

## 2021-12-28 NOTE — Discharge Instructions (Addendum)
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  This is a large blood vessel attack saw from your heart.  It measured 4.3 cm.  The radiologist recommends that you have a repeat scan in 6 to 12 months through your primary care doctor's office.  Please let your doctor know about this for the future.  Many aneurysms remained stable over the years did not require any surgery.  However some aneurysms continue swelling, putting you at risk for rupture or bursting.  This can be immediately life-threatening.  Therefore it needs to be monitored.  I strongly recommend that you stop smoking.  Smoking can lead to worsening aneurysms, lung disease, heart disease, or other life-threatening condition.
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
## 2021-12-28 NOTE — ED Provider Notes (Signed)
[facility] DEPT Provider Note   CSN: [id number] Arrival date & time: 12/28/21  1318     History  Chief Complaint  Patient presents with   Jeffery Downs is a 66 y.o. male presenting to ED with a fall off a ladder and neck injury.  The patient reports that he fell backwards off of a pressure washer today, several feet and landing on the pavement directly on his bilateral shoulders and upper neck.  He is back of his head struck on the pavement as well.  He felt very dazed afterwards.  This occurred around 11 AM.  He went to a local medical clinic and was referred to the ED for further evaluation.  He denies blood thinner use but does take Goody powders daily for chronic aches and pains.  He has no other medical issues.  He denies numbness in his hands or legs.  He reports pain in his upper neck and bilateral shoulders, worse with overhead arm raise.  HPI     Home Medications Prior to Admission medications   Medication Sig Start Date End Date Taking? Authorizing Provider  acetaminophen (TYLENOL) 325 MG tablet Take 2 tablets (650 mg total) by mouth every 6 (six) hours as needed for up to 30 doses. 12/28/21  Yes Shawntell Dixson, Carola Rhine, MD  cyclobenzaprine (FLEXERIL) 10 MG tablet Take 1 tablet (10 mg total) by mouth 2 (two) times daily as needed for up to 15 doses for muscle spasms. 12/28/21  Yes Lochlyn Zullo, Carola Rhine, MD  ibuprofen (ADVIL) 600 MG tablet Take 1 tablet (600 mg total) by mouth every 8 (eight) hours as needed for up to 30 doses for mild pain or moderate pain. 12/28/21  Yes Januel Doolan, Carola Rhine, MD  albuterol (PROVENTIL HFA;VENTOLIN HFA) 108 (90 BASE) MCG/ACT inhaler Inhale into the lungs every 6 (six) hours as needed for wheezing or shortness of breath.    [provider]  ALPRAZolam Duanne Moron) 1 MG tablet Take 1 mg by mouth at bedtime as needed.    [provider]  citalopram (CELEXA) 20 MG tablet Take 10 mg by mouth 2 (two) times  daily.     [provider]  fluticasone (FLONASE) 50 MCG/ACT nasal spray Place 1 spray into both nostrils daily. 07/06/17   Florencia Reasons, MD  guaiFENesin (MUCINEX) 600 MG 12 hr tablet Take 1 tablet (600 mg total) by mouth 2 (two) times daily. 07/06/17   Florencia Reasons, MD  indomethacin (INDOCIN) 50 MG capsule Take 50 mg by mouth 2 (two) times daily. 04/21/17   [provider]  pantoprazole (PROTONIX) 40 MG tablet Take 40 mg by mouth every morning.     [provider]  senna-docusate (SENOKOT-S) 8.6-50 MG tablet Take 1 tablet by mouth at bedtime as needed for mild constipation. 07/06/17   Florencia Reasons, MD      Allergies    Patient has no known allergies.    Review of Systems   Review of Systems  Physical Exam Updated Vital Signs BP (!) 142/86 (BP Location: Left Arm)   Pulse 60   Temp 98 F (36.7 C) (Oral)   Resp 17   SpO2 99%  Physical Exam Constitutional:      General: He is not in acute distress. HENT:     Head: Normocephalic.     Comments: Small hematoma over the occiput Eyes:     Conjunctiva/sclera: Conjunctivae normal.     Pupils: Pupils are equal, round, and  reactive to light.  Cardiovascular:     Rate and Rhythm: Normal rate and regular rhythm.  Pulmonary:     Effort: Pulmonary effort is normal. No respiratory distress.  Musculoskeletal:     Comments: The numbness of the lower cervical spine and upper thoracic spine midline, also paraspinal tenderness.  Bilateral scapula tenderness along the lower aspect of the scapula.  Patient is able to actively and passively perform full range of motion of the left shoulder with overhead arm raise.  He does have worsening pain in the left scapula with overhead arm raise  Skin:    General: Skin is warm and dry.  Neurological:     General: No focal deficit present.     Mental Status: He is alert. Mental status is at baseline.  Psychiatric:        Mood and Affect: Mood normal.        Behavior: Behavior normal.     ED  Results / Procedures / Treatments   Labs (all labs ordered are listed, but only abnormal results are displayed) Labs Reviewed - No data to display  EKG None  Radiology CT T-SPINE NO CHARGE  Result Date: 12/28/2021 CLINICAL DATA:  Trauma, fall EXAM: CT THORACIC SPINE WITHOUT CONTRAST TECHNIQUE: Multidetector CT images of the thoracic were obtained using the standard protocol without intravenous contrast. RADIATION DOSE REDUCTION: This exam was performed according to the departmental dose-optimization program which includes automated exposure control, adjustment of the mA and/or kV according to patient size and/or use of iterative reconstruction technique. COMPARISON:  None Available. FINDINGS: Alignment: Alignment of posterior margins of vertebral bodies appears normal. Vertebrae: No recent fracture is seen. Degenerative changes are noted with anterior bony spurs at multiple levels, more so in the lower thoracic spine. Paraspinal and other soft tissues: There is no central spinal stenosis. Disc levels: There is no significant narrowing of neural foramina. IMPRESSION: No fracture is seen in thoracic spine. Electronically Signed   By: Elmer Picker M.D.   On: 12/28/2021 15:19   CT Cervical Spine Wo Contrast  Result Date: 12/28/2021 CLINICAL DATA:  Trauma, fall EXAM: CT CERVICAL SPINE WITHOUT CONTRAST TECHNIQUE: Multidetector CT imaging of the cervical spine was performed without intravenous contrast. Multiplanar CT image reconstructions were also generated. RADIATION DOSE REDUCTION: This exam was performed according to the departmental dose-optimization program which includes automated exposure control, adjustment of the mA and/or kV according to patient size and/or use of iterative reconstruction technique. COMPARISON:  Cervical spine radiographs done on 04/30/2010 FINDINGS: Alignment: Alignment of posterior margins of vertebral bodies is within normal limits. Skull base and vertebrae: No recent  fracture is seen. Degenerative changes are noted with bony spurs at multiple levels. Soft tissues and spinal canal: There is no central spinal stenosis. Disc levels: There is disc space narrowing along with bony spurs at C5-C6 and C6-C7 levels. Bony spurs are noted at C3-C4 level. There is encroachment of neural foramina by bony spurs at C3-C4, C5-C6 and C6-C7 levels. There is interval progression of degenerative changes. Upper chest: Multiple blebs and bullae are seen in the periphery of both apical regions. Other: None. IMPRESSION: No recent fracture is seen in cervical spine. Cervical spondylosis with encroachment of neural foramina at C3-C4, C5-C6 and C6-C7 levels. There is interval progression of degenerative changes. Electronically Signed   By: Elmer Picker M.D.   On: 12/28/2021 15:17   CT Chest Wo Contrast  Result Date: 12/28/2021 CLINICAL DATA:  Trauma, fall EXAM: CT CHEST WITHOUT CONTRAST  TECHNIQUE: Multidetector CT imaging of the chest was performed following the standard protocol without IV contrast. RADIATION DOSE REDUCTION: This exam was performed according to the departmental dose-optimization program which includes automated exposure control, adjustment of the mA and/or kV according to patient size and/or use of iterative reconstruction technique. COMPARISON:  07/16/2021 FINDINGS: Cardiovascular: Coronary artery calcifications are seen. There is ectasia of ascending thoracic aorta measuring 4.3 cm. There is ectasia of main pulmonary artery measuring 3.9 cm. Mediastinum/Nodes: There are slightly enlarged lymph nodes in mediastinum which appears stable. Lungs/Pleura: There are multiple blebs and bullae in both apices. Linear densities are seen in the apices suggesting scarring. There is no new focal pulmonary consolidation. There is no pleural effusion or pneumothorax. Upper Abdomen: No acute findings are seen. Musculoskeletal: No recent displaced fractures are seen. IMPRESSION: No acute  findings are seen in noncontrast CT scan of chest. There is no evidence of mediastinal hematoma. There is no focal pulmonary consolidation. There is no pleural effusion or pneumothorax. No displaced fractures are seen. Coronary artery disease. There is ectasia of main pulmonary artery suggesting pulmonary arterial hypertension. Blebs and bullae are seen in the apices. There are linear densities in the apices suggesting scarring. There is 4.3 cm aneurysm in the ascending thoracic aorta. Recommend annual imaging followup by CTA or MRA. This recommendation follows 2010 ACCF/AHA/AATS/ACR/ASA/SCA/SCAI/SIR/STS/SVM Guidelines for the Diagnosis and Management of Patients with Thoracic Aortic Disease. Circulation. 2010; 121: D408-X448. Aortic aneurysm NOS (ICD10-I71.9) Electronically Signed   By: Elmer Picker M.D.   On: 12/28/2021 15:11   CT Head Wo Contrast  Result Date: 12/28/2021 CLINICAL DATA:  Head trauma, moderate-severe Fall off pressure washer. Hit head. EXAM: CT HEAD WITHOUT CONTRAST TECHNIQUE: Contiguous axial images were obtained from the base of the skull through the vertex without intravenous contrast. RADIATION DOSE REDUCTION: This exam was performed according to the departmental dose-optimization program which includes automated exposure control, adjustment of the mA and/or kV according to patient size and/or use of iterative reconstruction technique. COMPARISON:  None Available. FINDINGS: Brain: No intracranial hemorrhage, mass effect, or midline shift. No hydrocephalus. The basilar cisterns are patent. No evidence of territorial infarct or acute ischemia. No extra-axial or intracranial fluid collection. Vascular: No hyperdense vessel or unexpected calcification. Skull: No fracture or focal lesion. Sinuses/Orbits: Subtotal opacification of left mastoid air cells. No visible temporal bone fracture. Minimal mucosal thickening of the sphenoid sinus and ethmoid air cells. Right cataract resection.  Other: Small right parietal scalp hematoma. IMPRESSION: 1. Small right parietal scalp hematoma. No acute intracranial abnormality. No skull fracture. 2. Subtotal opacification of left mastoid air cells without visible temporal bone fracture, of unknown acuity, possibly chronic. Electronically Signed   By: Keith Rake M.D.   On: 12/28/2021 15:05    Procedures Procedures    Medications Ordered in ED Medications  ibuprofen (ADVIL) tablet 600 mg (600 mg Oral Given 12/28/21 1435)  acetaminophen (TYLENOL) tablet 1,000 mg (1,000 mg Oral Given 12/28/21 1435)    ED Course/ Medical Decision Making/ A&P                           Medical Decision Making Amount and/or Complexity of Data Reviewed Radiology: ordered.  Risk OTC drugs. Prescription drug management.   Patient is here with a mechanical fall complaining of upper neck and bilateral shoulder pain.  He has a small head hematoma.  He is otherwise neurovascularly intact, and has no visible deformities of the extremities.  CT imaging of the head and C and T-spine were ordered, as well as the chest to evaluate for scapular fracture underlying rib fracture.  His images were personally reviewed and interpreted, showing incidental aortic aneurysm, no acute fracture or PTX.  He is a smoker.  We discussed smoking cessation which will likely help with emphysematous changes of the lungs as well as aortic aneurysm.  Patient made aware of aortic aneurysm as an incidental finding and need for PCP follow-up for this.  Tylenol and ibuprofen was given for pain.  Muscle relaxers will be prescribed at home.  He is otherwise well enough appearing for discharge.  Explained that he may have a mild concussion based on his symptoms.        Final Clinical Impression(s) / ED Diagnoses Final diagnoses:  Fall, initial encounter  Aneurysm of ascending aorta without rupture (HCC)  Neck pain    Rx / DC Orders ED Discharge Orders          Ordered     ibuprofen (ADVIL) 600 MG tablet  Every 8 hours PRN        12/28/21 1534    acetaminophen (TYLENOL) 325 MG tablet  Every 6 hours PRN        12/28/21 1534    cyclobenzaprine (FLEXERIL) 10 MG tablet  2 times daily PRN        12/28/21 1534              Wyvonnia Dusky, MD 12/28/21 1539
